# Patient Record
Sex: Male | Born: 1950 | Race: White | Hispanic: No
Health system: Southern US, Community
[De-identification: ages and names within clinical notes are randomized; demographics above are authoritative.]

## PROBLEM LIST (undated history)

## (undated) DIAGNOSIS — I480 Paroxysmal atrial fibrillation: Secondary | ICD-10-CM

## (undated) DIAGNOSIS — Z930 Tracheostomy status: Secondary | ICD-10-CM

## (undated) DIAGNOSIS — J9621 Acute and chronic respiratory failure with hypoxia: Secondary | ICD-10-CM

## (undated) DIAGNOSIS — J189 Pneumonia, unspecified organism: Secondary | ICD-10-CM

---

## 2020-11-02 ENCOUNTER — Inpatient Hospital Stay
Admit: 2020-11-02 | Discharge: 2020-12-22 | Disposition: E | Payer: Medicare Other | Source: Other Acute Inpatient Hospital | Attending: Internal Medicine | Admitting: Internal Medicine

## 2020-11-02 ENCOUNTER — Other Ambulatory Visit (HOSPITAL_COMMUNITY): Payer: Medicare Other

## 2020-11-02 DIAGNOSIS — J939 Pneumothorax, unspecified: Secondary | ICD-10-CM

## 2020-11-02 DIAGNOSIS — J942 Hemothorax: Secondary | ICD-10-CM

## 2020-11-02 DIAGNOSIS — Z931 Gastrostomy status: Secondary | ICD-10-CM

## 2020-11-02 DIAGNOSIS — R0902 Hypoxemia: Secondary | ICD-10-CM

## 2020-11-02 DIAGNOSIS — J984 Other disorders of lung: Secondary | ICD-10-CM

## 2020-11-02 DIAGNOSIS — Z9689 Presence of other specified functional implants: Secondary | ICD-10-CM

## 2020-11-02 DIAGNOSIS — I480 Paroxysmal atrial fibrillation: Secondary | ICD-10-CM | POA: Diagnosis present

## 2020-11-02 DIAGNOSIS — K7460 Unspecified cirrhosis of liver: Secondary | ICD-10-CM

## 2020-11-02 DIAGNOSIS — Z930 Tracheostomy status: Secondary | ICD-10-CM

## 2020-11-02 DIAGNOSIS — J9621 Acute and chronic respiratory failure with hypoxia: Secondary | ICD-10-CM | POA: Diagnosis present

## 2020-11-02 DIAGNOSIS — R1310 Dysphagia, unspecified: Secondary | ICD-10-CM

## 2020-11-02 DIAGNOSIS — Z9911 Dependence on respirator [ventilator] status: Secondary | ICD-10-CM

## 2020-11-02 DIAGNOSIS — R509 Fever, unspecified: Secondary | ICD-10-CM

## 2020-11-02 DIAGNOSIS — J189 Pneumonia, unspecified organism: Secondary | ICD-10-CM

## 2020-11-02 DIAGNOSIS — J9690 Respiratory failure, unspecified, unspecified whether with hypoxia or hypercapnia: Secondary | ICD-10-CM

## 2020-11-02 DIAGNOSIS — Z0189 Encounter for other specified special examinations: Secondary | ICD-10-CM

## 2020-11-02 HISTORY — DX: Other disorders of lung: J18.9

## 2020-11-02 HISTORY — DX: Acute and chronic respiratory failure with hypoxia: J96.21

## 2020-11-02 HISTORY — DX: Tracheostomy status: Z93.0

## 2020-11-02 HISTORY — DX: Paroxysmal atrial fibrillation: I48.0

## 2020-11-02 LAB — BLOOD GAS, ARTERIAL
Acid-Base Excess: 5 mmol/L — ABNORMAL HIGH (ref 0.0–2.0)
Bicarbonate: 32.7 mmol/L — ABNORMAL HIGH (ref 20.0–28.0)
FIO2: 50
O2 Saturation: 96.6 %
Patient temperature: 37
pCO2 arterial: 87.5 mmHg (ref 32.0–48.0)
pH, Arterial: 7.197 — CL (ref 7.350–7.450)
pO2, Arterial: 105 mmHg (ref 83.0–108.0)

## 2020-11-03 ENCOUNTER — Encounter: Payer: Self-pay | Admitting: Internal Medicine

## 2020-11-03 ENCOUNTER — Other Ambulatory Visit (HOSPITAL_COMMUNITY): Payer: Medicare Other

## 2020-11-03 DIAGNOSIS — J189 Pneumonia, unspecified organism: Secondary | ICD-10-CM | POA: Diagnosis not present

## 2020-11-03 DIAGNOSIS — J984 Other disorders of lung: Secondary | ICD-10-CM

## 2020-11-03 DIAGNOSIS — Z930 Tracheostomy status: Secondary | ICD-10-CM

## 2020-11-03 DIAGNOSIS — J9621 Acute and chronic respiratory failure with hypoxia: Secondary | ICD-10-CM | POA: Diagnosis not present

## 2020-11-03 DIAGNOSIS — I480 Paroxysmal atrial fibrillation: Secondary | ICD-10-CM | POA: Diagnosis not present

## 2020-11-03 LAB — APTT: aPTT: 38 seconds — ABNORMAL HIGH (ref 24–36)

## 2020-11-03 LAB — CBC
HCT: 26.2 % — ABNORMAL LOW (ref 39.0–52.0)
Hemoglobin: 7.4 g/dL — ABNORMAL LOW (ref 13.0–17.0)
MCH: 27.9 pg (ref 26.0–34.0)
MCHC: 28.2 g/dL — ABNORMAL LOW (ref 30.0–36.0)
MCV: 98.9 fL (ref 80.0–100.0)
Platelets: 210 10*3/uL (ref 150–400)
RBC: 2.65 MIL/uL — ABNORMAL LOW (ref 4.22–5.81)
RDW: 21.9 % — ABNORMAL HIGH (ref 11.5–15.5)
WBC: 12.4 10*3/uL — ABNORMAL HIGH (ref 4.0–10.5)
nRBC: 0 % (ref 0.0–0.2)

## 2020-11-03 LAB — BLOOD GAS, ARTERIAL
Acid-Base Excess: 6.2 mmol/L — ABNORMAL HIGH (ref 0.0–2.0)
Acid-Base Excess: 8.3 mmol/L — ABNORMAL HIGH (ref 0.0–2.0)
Bicarbonate: 33.1 mmol/L — ABNORMAL HIGH (ref 20.0–28.0)
Bicarbonate: 33.8 mmol/L — ABNORMAL HIGH (ref 20.0–28.0)
FIO2: 50
FIO2: 40
O2 Saturation: 98.1 %
O2 Saturation: 95.9 %
Patient temperature: 37
Patient temperature: 37
pCO2 arterial: 77.4 mmHg (ref 32.0–48.0)
pCO2 arterial: 62.3 mmHg — ABNORMAL HIGH (ref 32.0–48.0)
pH, Arterial: 7.254 — ABNORMAL LOW (ref 7.350–7.450)
pH, Arterial: 7.354 (ref 7.350–7.450)
pO2, Arterial: 115 mmHg — ABNORMAL HIGH (ref 83.0–108.0)
pO2, Arterial: 78.6 mmHg — ABNORMAL LOW (ref 83.0–108.0)

## 2020-11-03 LAB — HEMOGLOBIN A1C
Hgb A1c MFr Bld: 5.4 % (ref 4.8–5.6)
Mean Plasma Glucose: 108.28 mg/dL

## 2020-11-03 LAB — URINALYSIS, ROUTINE W REFLEX MICROSCOPIC
Bacteria, UA: NONE SEEN
Bilirubin Urine: NEGATIVE
Glucose, UA: NEGATIVE mg/dL
Ketones, ur: NEGATIVE mg/dL
Leukocytes,Ua: NEGATIVE
Nitrite: NEGATIVE
Protein, ur: 30 mg/dL — AB
RBC / HPF: >50 RBC/hpf — ABNORMAL HIGH (ref 0–5)
Specific Gravity, Urine: 1.015 (ref 1.005–1.030)
pH: 7 (ref 5.0–8.0)

## 2020-11-03 LAB — T4, FREE: Free T4: 0.7 ng/dL (ref 0.61–1.12)

## 2020-11-03 LAB — COMPREHENSIVE METABOLIC PANEL
ALT: 52 U/L — ABNORMAL HIGH (ref 0–44)
AST: 48 U/L — ABNORMAL HIGH (ref 15–41)
Albumin: 1.6 g/dL — ABNORMAL LOW (ref 3.5–5.0)
Alkaline Phosphatase: 75 U/L (ref 38–126)
Anion gap: 7 (ref 5–15)
BUN: 20 mg/dL (ref 8–23)
CO2: 33 mmol/L — ABNORMAL HIGH (ref 22–32)
Calcium: 8.4 mg/dL — ABNORMAL LOW (ref 8.9–10.3)
Chloride: 108 mmol/L (ref 98–111)
Creatinine, Ser: 0.77 mg/dL (ref 0.61–1.24)
GFR, Estimated: >60 mL/min (ref >60)
Glucose, Bld: 115 mg/dL — ABNORMAL HIGH (ref 70–99)
Potassium: 3.9 mmol/L (ref 3.5–5.1)
Sodium: 148 mmol/L — ABNORMAL HIGH (ref 135–145)
Total Bilirubin: 0.5 mg/dL (ref 0.3–1.2)
Total Protein: 6.7 g/dL (ref 6.5–8.1)

## 2020-11-03 LAB — PROTIME-INR
INR: 1.3 — ABNORMAL HIGH (ref 0.8–1.2)
Prothrombin Time: 15.6 seconds — ABNORMAL HIGH (ref 11.4–15.2)

## 2020-11-03 LAB — LACTIC ACID, PLASMA: Lactic Acid, Venous: 0.8 mmol/L (ref 0.5–1.9)

## 2020-11-03 LAB — VANCOMYCIN, TROUGH: Vancomycin Tr: 21 ug/mL (ref 15–20)

## 2020-11-03 LAB — TSH: TSH: 0.862 u[IU]/mL (ref 0.350–4.500)

## 2020-11-03 MED ORDER — IOHEXOL 300 MG/ML  SOLN
75.0000 mL | Freq: Once | INTRAMUSCULAR | Status: AC | PRN
  Administered 2020-11-03: 75 mL via INTRAVENOUS

## 2020-11-03 NOTE — Consult Note (Signed)
Pulmonary Critical Care Medicine [facility] [place]  PULMONARY SERVICE  Date of Service: 11/03/2020  PULMONARY CRITICAL CARE CONSULT   Michael Massey  ACZ:[id number]  DOB: 02/13/1951   DOA: 11/07/2020  Referring Physician: Carron Curie, MD  HPI: Michael Massey is a 69 y.o. male seen for follow up of Acute on Chronic Respiratory Failure.  Patient has on multiple medical problems including GERD pleural effusions enlarged lymph nodes hypertension came into the hospital because of increasing shortness of breath.  At the time of evaluation patient was found to have a cavitating mass with an air-fluid level in the lower lobes.  Patient also had some nodules noted.  The patient did undergo bronchoscopy which did not reveal malignancy but had inflammatory cells noted.  Apparently the condition became worse and patient ended up intubated on mechanical ventilation.  Hospital course was further complicated by development of sepsis and shock and atrial fibrillation.  Patient was not able to come off of the ventilator and subsequently had to have a tracheostomy done.  Transferred now to our facility for further management and weaning.  Review of Systems:  ROS performed and is unremarkable other than noted above.  Past medical history: Hypertension Sleep apnea Chronic Cough GERD Obesity Hernia   Past surgical history: Bronchoscopy Tracheostomy Hernia repair  Social history: Never smoker No alcohol or drug abuse  Family history: Diabetes Myocardial infarction  Medications: Reviewed on Rounds  Physical Exam:  Vitals: Temperature 98.0 pulse 86 respiratory 26 blood pressure is 100/56 saturations 95%  Ventilator Settings on assist control FiO2 is 45% tidal volume 530 PEEP 8  . General: Comfortable at this time . Eyes: Grossly normal lids, irises & conjunctiva . ENT: grossly tongue is normal . Neck: no obvious mass . Cardiovascular: S1-S2 normal no gallop or rub . Respiratory:  Scattered coarse breath sounds few rhonchi. . Abdomen: Soft and nontender. . Skin: no rash seen on limited exam . Musculoskeletal: not rigid . Psychiatric:unable to assess . Neurologic: no seizure no involuntary movements         Labs on Admission:  Basic Metabolic Panel: No results for input(s): NA, K, CL, CO2, GLUCOSE, BUN, CREATININE, CALCIUM, MG, PHOS in the last 168 hours.  Recent Labs  Lab 11/14/2020 2211 11/03/20 0040 11/03/20 0541  PHART 7.197* 7.254* 7.354  PCO2ART 87.5* 77.4* 62.3*  PO2ART 105 115* 78.6*  HCO3 32.7* 33.1* 33.8*  O2SAT 96.6 98.1 95.9    Liver Function Tests: No results for input(s): AST, ALT, ALKPHOS, BILITOT, PROT, ALBUMIN in the last 168 hours. No results for input(s): LIPASE, AMYLASE in the last 168 hours. No results for input(s): AMMONIA in the last 168 hours.  CBC: No results for input(s): WBC, NEUTROABS, HGB, HCT, MCV, PLT in the last 168 hours.  Cardiac Enzymes: No results for input(s): CKTOTAL, CKMB, CKMBINDEX, TROPONINI in the last 168 hours.  BNP (last 3 results) No results for input(s): BNP in the last 8760 hours.  ProBNP (last 3 results) No results for input(s): PROBNP in the last 8760 hours.   Radiological Exams on Admission: DG Abd 1 View  Result Date: 11/01/2020 CLINICAL DATA:  69 year old male respiratory failure, enteric tube placement. EXAM: ABDOMEN - 1 VIEW COMPARISON:  Portable chest tonight. FINDINGS: Portable AP supine view at 2222 hours. Enteric feeding tube courses from the left upper quadrant across midline and terminates at the level of the mid duodenum. Non obstructed bowel gas pattern. Coarse, reticular pulmonary opacity at both visible lung bases. No acute osseous  abnormality identified. IMPRESSION: Enteric feeding tube terminates in the mid duodenum. Electronically Signed   By: Odessa Fleming M.D.   On: 10/29/2020 22:37   DG Chest Port 1 View  Result Date: 11/12/2020 CLINICAL DATA:  69 year old male respiratory failure,  enteric tube placement. EXAM: PORTABLE CHEST 1 VIEW COMPARISON:  None. FINDINGS: Portable AP semi upright view at 2217 hours. Diffuse coarse pulmonary interstitial opacity. Medial left lower lung 10 cm cavitary lesion or emphysematous bulla. No pneumothorax. No definite pleural effusion. No cardiomegaly. Nonspecific superior mediastinal and right paratracheal increased mediastinal density. Enteric feeding tube courses through the chest into the abdomen. No acute osseous abnormality identified. IMPRESSION: 1. Enteric feeding tube courses through the chest into the abdomen. 2. Moderate to severe diffuse coarse pulmonary interstitial opacity which could be chronic lung disease and/or viral/atypical pneumonia. Pulmonary edema felt unlikely. 10 cm cavitary lesion or emphysematous bulla superimposed at the medial lung base. 3. Nonspecific widening of the right paratracheal, mediastinal contour. Query mediastinal mass or lymphadenopathy. Electronically Signed   By: Odessa Fleming M.D.   On: 10/29/2020 22:36    Assessment/Plan Active Problems:   Acute on chronic respiratory failure with hypoxia (HCC)   Paroxysmal atrial fibrillation (HCC)   Cavitary pneumonia   Tracheostomy status (HCC)   1. Acute on chronic respiratory failure with hypoxia remains on the ventilator and full support on assist control mode patient has not been tolerating attempts at weaning we will have respiratory therapy continue to assess the RSB I try pressure support today 2. Paroxysmal atrial fibrillation rate now rate controlled we will continue with supportive care anticoagulation precautions. 3. Cavitary pneumonia patient has large cavitary infectious process going on likely I would recommend getting a CT scan of the chest could be an infective bleb 4. Tracheostomy status tracheostomy will remain in place trachea  I have personally seen and evaluated the patient, evaluated laboratory and imaging results, formulated the assessment and plan  and placed orders. The Patient requires high complexity decision making with multiple systems involvement.  Case was discussed on Rounds with the Respiratory Therapy Director and the Respiratory staff Time Spent  Yevonne Pax, MD [facility] Pulmonary Critical Care Medicine Sleep Medicine

## 2020-11-04 DIAGNOSIS — J9621 Acute and chronic respiratory failure with hypoxia: Secondary | ICD-10-CM | POA: Diagnosis not present

## 2020-11-04 DIAGNOSIS — J189 Pneumonia, unspecified organism: Secondary | ICD-10-CM | POA: Diagnosis not present

## 2020-11-04 DIAGNOSIS — Z930 Tracheostomy status: Secondary | ICD-10-CM | POA: Diagnosis not present

## 2020-11-04 DIAGNOSIS — I480 Paroxysmal atrial fibrillation: Secondary | ICD-10-CM | POA: Diagnosis not present

## 2020-11-04 LAB — URINE CULTURE: Culture: NO GROWTH

## 2020-11-04 LAB — BASIC METABOLIC PANEL
Anion gap: 7 (ref 5–15)
BUN: 19 mg/dL (ref 8–23)
CO2: 34 mmol/L — ABNORMAL HIGH (ref 22–32)
Calcium: 8.4 mg/dL — ABNORMAL LOW (ref 8.9–10.3)
Chloride: 106 mmol/L (ref 98–111)
Creatinine, Ser: 0.73 mg/dL (ref 0.61–1.24)
GFR, Estimated: >60 mL/min (ref >60)
Glucose, Bld: 134 mg/dL — ABNORMAL HIGH (ref 70–99)
Potassium: 3.4 mmol/L — ABNORMAL LOW (ref 3.5–5.1)
Sodium: 147 mmol/L — ABNORMAL HIGH (ref 135–145)

## 2020-11-04 LAB — CBC
HCT: 27 % — ABNORMAL LOW (ref 39.0–52.0)
Hemoglobin: 7.6 g/dL — ABNORMAL LOW (ref 13.0–17.0)
MCH: 27.8 pg (ref 26.0–34.0)
MCHC: 28.1 g/dL — ABNORMAL LOW (ref 30.0–36.0)
MCV: 98.9 fL (ref 80.0–100.0)
Platelets: 205 10*3/uL (ref 150–400)
RBC: 2.73 MIL/uL — ABNORMAL LOW (ref 4.22–5.81)
RDW: 22 % — ABNORMAL HIGH (ref 11.5–15.5)
WBC: 12.5 10*3/uL — ABNORMAL HIGH (ref 4.0–10.5)
nRBC: 0 % (ref 0.0–0.2)

## 2020-11-04 LAB — VANCOMYCIN, TROUGH: Vancomycin Tr: 13 ug/mL — ABNORMAL LOW (ref 15–20)

## 2020-11-04 LAB — MAGNESIUM: Magnesium: 2.1 mg/dL (ref 1.7–2.4)

## 2020-11-04 LAB — PHOSPHORUS: Phosphorus: 3.2 mg/dL (ref 2.5–4.6)

## 2020-11-04 NOTE — Progress Notes (Signed)
Pulmonary Critical Care Medicine [facility] [place]   PULMONARY CRITICAL CARE SERVICE  PROGRESS NOTE  Date of Service: 11/04/2020  Michael Massey  MVH:[id number]  DOB: 1951/05/16   DOA: 21-Nov-2020  Referring Physician: Carron Curie, MD  HPI: Michael Massey is a 69 y.o. male seen for follow up of Acute on Chronic Respiratory Failure. 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 Did not appear that any of the bullae are infected  Medications: Reviewed on Rounds  Physical Exam:  Vitals: Temperature is 98.1 pulse 88 respiratory 24 blood pressure is 135/69 saturations 98%  Ventilator Settings on pressure support FiO2 is 45%   General: Comfortable at this time  Eyes: Grossly normal lids, irises & conjunctiva  ENT: grossly tongue is normal  Neck: no obvious mass  Cardiovascular: S1 S2 normal no gallop  Respiratory: No rhonchi very coarse breath sounds  Abdomen: soft  Skin: no rash seen on limited exam  Musculoskeletal: not rigid  Psychiatric:unable to assess  Neurologic: no seizure no involuntary movements         Lab Data:   Basic Metabolic Panel: Recent Labs  Lab 11/03/20 1041 11/04/20 0512  NA 148* 147*  K 3.9 3.4*  CL 108 106  CO2 33* 34*  GLUCOSE 115* 134*  BUN 20 19  CREATININE 0.77 0.73  CALCIUM 8.4* 8.4*  MG  --  2.1  PHOS  --  3.2    ABG: Recent Labs  Lab 11/21/2020 2211 11/03/20 0040 11/03/20 0541  PHART 7.197* 7.254* 7.354  PCO2ART 87.5* 77.4* 62.3*  PO2ART 105 115* 78.6*  HCO3 32.7* 33.1* 33.8*  O2SAT 96.6 98.1 95.9    Liver Function Tests: Recent Labs  Lab 11/03/20 1041  AST 48*  ALT 52*  ALKPHOS 75  BILITOT 0.5  PROT 6.7  ALBUMIN 1.6*   No results for input(s): LIPASE, AMYLASE in the last 168 hours. No results for input(s): AMMONIA in the last 168 hours.  CBC: Recent Labs  Lab 11/03/20 1041 11/04/20 0512  WBC 12.4* 12.5*  HGB 7.4* 7.6*  HCT 26.2* 27.0*  MCV 98.9 98.9   PLT 210 205    Cardiac Enzymes: No results for input(s): CKTOTAL, CKMB, CKMBINDEX, TROPONINI in the last 168 hours.  BNP (last 3 results) No results for input(s): BNP in the last 8760 hours.  ProBNP (last 3 results) No results for input(s): PROBNP in the last 8760 hours.  Radiological Exams: DG Abd 1 View  Result Date: 2020/11/21 CLINICAL DATA:  69 year old male respiratory failure, enteric tube placement. EXAM: ABDOMEN - 1 VIEW COMPARISON:  Portable chest tonight. FINDINGS: Portable AP supine view at 2222 hours. Enteric feeding tube courses from the left upper quadrant across midline and terminates at the level of the mid duodenum. Non obstructed bowel gas pattern. Coarse, reticular pulmonary opacity at both visible lung bases. No acute osseous abnormality identified. IMPRESSION: Enteric feeding tube terminates in the mid duodenum. Electronically Signed   By: Odessa Fleming M.D.   On: 21-Nov-2020 22:37   CT CHEST W CONTRAST  Result Date: 11/03/2020 CLINICAL DATA:  69 year old male with cancer of unknown origin. Staging. EXAM: CT CHEST WITH CONTRAST TECHNIQUE: Multidetector CT imaging of the chest was performed during intravenous contrast administration. CONTRAST:  41mL OMNIPAQUE IOHEXOL 300 MG/ML  SOLN COMPARISON:  Chest radiograph dated 21-Nov-2020. FINDINGS: Cardiovascular: There is no cardiomegaly or pericardial effusion. There is coronary vascular calcification. The thoracic aorta is unremarkable. The origins of the  great vessels of the aortic arch appear patent. Evaluation of the pulmonary arteries is limited due to respiratory motion artifact and suboptimal opacification and timing of the contrast. The central pulmonary arteries are grossly unremarkable. Mediastinum/Nodes: Mediastinal adenopathy measure 18 mm short axis along the right trachea, 13 mm posterior to the trachea, 14 mm in the subcarinal space. Evaluation of the hilar lymph nodes is somewhat limited due to consolidative changes of  the lungs. A feeding tube is noted within the esophagus. No mediastinal fluid collection. Lungs/Pleura: There are small bilateral pleural effusions. There is background of emphysema with subpleural blebs. There is diffuse ground-glass opacity throughout the lungs. There are bilateral bronchiectasis. There are small pleural effusions in the fissures bilaterally. There is a 13 mm nodule in the left lower lobe along the fissure. Scattered nodular densities noted in the right middle lobe. There is no pneumothorax. The central airways are patent. Tracheostomy with tip 5.5 cm above the carina. Upper Abdomen: Small bilateral adrenal nodules, indeterminate. Partially visualized left renal upper pole cyst. Musculoskeletal: Degenerative changes of the spine. No acute osseous pathology. IMPRESSION: 1. Diffuse ground-glass opacities throughout the lungs may represent edema, ARDS, or pneumonia. There is probable background of interstitial lung disease with bronchiectasis as well as emphysema and bullous changes of the lungs. 2. A 13 mm pulmonary nodule in the left lower lobe along the fissure. Additional smaller nodules in the right middle and right upper lobes. Clinical correlation and follow-up with CT in 3 months recommended. 3. Mediastinal adenopathy. 4. Small bilateral adrenal nodules, indeterminate. 5. Aortic Atherosclerosis (ICD10-I70.0) and Emphysema (ICD10-J43.9). Electronically Signed   By: Elgie Collard M.D.   On: 11/03/2020 15:55   DG Chest Port 1 View  Result Date: 11/17/2020 CLINICAL DATA:  69 year old male respiratory failure, enteric tube placement. EXAM: PORTABLE CHEST 1 VIEW COMPARISON:  None. FINDINGS: Portable AP semi upright view at 2217 hours. Diffuse coarse pulmonary interstitial opacity. Medial left lower lung 10 cm cavitary lesion or emphysematous bulla. No pneumothorax. No definite pleural effusion. No cardiomegaly. Nonspecific superior mediastinal and right paratracheal increased mediastinal  density. Enteric feeding tube courses through the chest into the abdomen. No acute osseous abnormality identified. IMPRESSION: 1. Enteric feeding tube courses through the chest into the abdomen. 2. Moderate to severe diffuse coarse pulmonary interstitial opacity which could be chronic lung disease and/or viral/atypical pneumonia. Pulmonary edema felt unlikely. 10 cm cavitary lesion or emphysematous bulla superimposed at the medial lung base. 3. Nonspecific widening of the right paratracheal, mediastinal contour. Query mediastinal mass or lymphadenopathy. Electronically Signed   By: Odessa Fleming M.D.   On: 11/12/2020 22:36    Assessment/Plan Active Problems:   Acute on chronic respiratory failure with hypoxia (HCC)   Paroxysmal atrial fibrillation (HCC)   Cavitary pneumonia   Tracheostomy status (HCC)   1. Acute on chronic respiratory failure with hypoxia patient continues on pressure support the goal for weaning is 4 hours today in light of the findings of the CT scan it will be a difficult weaning process but we will see how the patient is able to tolerate 2. Paroxysmal atrial fibrillation rate now rate controlled 3. Cavitary pneumonia it appears that the cavitary pneumonia is more likely actually bullous emphysema on the CT 4. Tracheostomy will remain in place   I have personally seen and evaluated the patient, evaluated laboratory and imaging results, formulated the assessment and plan and placed orders. The Patient requires high complexity decision making with multiple systems involvement.  Rounds were done  with the Respiratory Therapy Director and Staff therapists and discussed with nursing staff also.  Allyne Gee, MD [facility] Pulmonary Critical Care Medicine Sleep Medicine

## 2020-11-05 DIAGNOSIS — I480 Paroxysmal atrial fibrillation: Secondary | ICD-10-CM | POA: Diagnosis not present

## 2020-11-05 DIAGNOSIS — J9621 Acute and chronic respiratory failure with hypoxia: Secondary | ICD-10-CM | POA: Diagnosis not present

## 2020-11-05 DIAGNOSIS — Z930 Tracheostomy status: Secondary | ICD-10-CM | POA: Diagnosis not present

## 2020-11-05 DIAGNOSIS — J189 Pneumonia, unspecified organism: Secondary | ICD-10-CM | POA: Diagnosis not present

## 2020-11-05 LAB — BASIC METABOLIC PANEL
Anion gap: 6 (ref 5–15)
BUN: 17 mg/dL (ref 8–23)
CO2: 31 mmol/L (ref 22–32)
Calcium: 8.2 mg/dL — ABNORMAL LOW (ref 8.9–10.3)
Chloride: 107 mmol/L (ref 98–111)
Creatinine, Ser: 0.62 mg/dL (ref 0.61–1.24)
GFR, Estimated: >60 mL/min (ref >60)
Glucose, Bld: 134 mg/dL — ABNORMAL HIGH (ref 70–99)
Potassium: 3.5 mmol/L (ref 3.5–5.1)
Sodium: 144 mmol/L (ref 135–145)

## 2020-11-05 LAB — CULTURE, RESPIRATORY W GRAM STAIN: Culture: NORMAL

## 2020-11-05 LAB — CBC
HCT: 26.7 % — ABNORMAL LOW (ref 39.0–52.0)
Hemoglobin: 7.6 g/dL — ABNORMAL LOW (ref 13.0–17.0)
MCH: 28 pg (ref 26.0–34.0)
MCHC: 28.5 g/dL — ABNORMAL LOW (ref 30.0–36.0)
MCV: 98.5 fL (ref 80.0–100.0)
Platelets: 180 10*3/uL (ref 150–400)
RBC: 2.71 MIL/uL — ABNORMAL LOW (ref 4.22–5.81)
RDW: 21.7 % — ABNORMAL HIGH (ref 11.5–15.5)
WBC: 13.5 10*3/uL — ABNORMAL HIGH (ref 4.0–10.5)
nRBC: 0 % (ref 0.0–0.2)

## 2020-11-05 LAB — MAGNESIUM: Magnesium: 2 mg/dL (ref 1.7–2.4)

## 2020-11-05 LAB — PHOSPHORUS: Phosphorus: 2.7 mg/dL (ref 2.5–4.6)

## 2020-11-05 NOTE — Progress Notes (Signed)
Pulmonary Critical Care Medicine [facility] [place]   PULMONARY CRITICAL CARE SERVICE  PROGRESS NOTE  Date of Service: 11/05/2020  Michael Massey  [id number]:[id number]  DOB: 12-30-1950   DOA: 2020-11-17  Referring Physician: Carron Curie, MD  HPI: Michael Massey is a 69 y.o. male seen for follow up of Acute on Chronic Respiratory Failure.  Patient currently is on assist control has been on 45% FiO2  Medications: Reviewed on Rounds  Physical Exam:  Vitals: Temperature is 98.4 pulse 71 respiratory rate 25 blood pressure is 119/61 saturations 100%  Ventilator Settings on assist control FiO2 is 45% tidal volume 530 PEEP of 8   General: Comfortable at this time  Eyes: Grossly normal lids, irises & conjunctiva  ENT: grossly tongue is normal  Neck: no obvious mass  Cardiovascular: S1 S2 normal no gallop  Respiratory: No rhonchi no rales are noted    Abdomen: soft  Skin: no rash seen on limited exam  Musculoskeletal: not rigid  Psychiatric:unable to assess  Neurologic: no seizure no involuntary movements         Lab Data:   Basic Metabolic Panel: Recent Labs  Lab 11/03/20 1041 11/04/20 0512 11/05/20 0649  NA 148* 147* 144  K 3.9 3.4* 3.5  CL 108 106 107  CO2 33* 34* 31  GLUCOSE 115* 134* 134*  BUN 20 19 17   CREATININE 0.77 0.73 0.62  CALCIUM 8.4* 8.4* 8.2*  MG  --  2.1 2.0  PHOS  --  3.2 2.7    ABG: Recent Labs  Lab 11/17/20 2211 11/03/20 0040 11/03/20 0541  PHART 7.197* 7.254* 7.354  PCO2ART 87.5* 77.4* 62.3*  PO2ART 105 115* 78.6*  HCO3 32.7* 33.1* 33.8*  O2SAT 96.6 98.1 95.9    Liver Function Tests: Recent Labs  Lab 11/03/20 1041  AST 48*  ALT 52*  ALKPHOS 75  BILITOT 0.5  PROT 6.7  ALBUMIN 1.6*   No results for input(s): LIPASE, AMYLASE in the last 168 hours. No results for input(s): AMMONIA in the last 168 hours.  CBC: Recent Labs  Lab 11/03/20 1041 11/04/20 0512 11/05/20 0649  WBC 12.4* 12.5* 13.5*  HGB 7.4* 7.6*  7.6*  HCT 26.2* 27.0* 26.7*  MCV 98.9 98.9 98.5  PLT 210 205 180    Cardiac Enzymes: No results for input(s): CKTOTAL, CKMB, CKMBINDEX, TROPONINI in the last 168 hours.  BNP (last 3 results) No results for input(s): BNP in the last 8760 hours.  ProBNP (last 3 results) No results for input(s): PROBNP in the last 8760 hours.  Radiological Exams: CT CHEST W CONTRAST  Result Date: 11/03/2020 CLINICAL DATA:  69 year old male with cancer of unknown origin. Staging. EXAM: CT CHEST WITH CONTRAST TECHNIQUE: Multidetector CT imaging of the chest was performed during intravenous contrast administration. CONTRAST:  39mL OMNIPAQUE IOHEXOL 300 MG/ML  SOLN COMPARISON:  Chest radiograph dated 17-Nov-2020. FINDINGS: Cardiovascular: There is no cardiomegaly or pericardial effusion. There is coronary vascular calcification. The thoracic aorta is unremarkable. The origins of the great vessels of the aortic arch appear patent. Evaluation of the pulmonary arteries is limited due to respiratory motion artifact and suboptimal opacification and timing of the contrast. The central pulmonary arteries are grossly unremarkable. Mediastinum/Nodes: Mediastinal adenopathy measure 18 mm short axis along the right trachea, 13 mm posterior to the trachea, 14 mm in the subcarinal space. Evaluation of the hilar lymph nodes is somewhat limited due to consolidative changes of the lungs. A feeding tube is noted within the esophagus. No mediastinal fluid  collection. Lungs/Pleura: There are small bilateral pleural effusions. There is background of emphysema with subpleural blebs. There is diffuse ground-glass opacity throughout the lungs. There are bilateral bronchiectasis. There are small pleural effusions in the fissures bilaterally. There is a 13 mm nodule in the left lower lobe along the fissure. Scattered nodular densities noted in the right middle lobe. There is no pneumothorax. The central airways are patent. Tracheostomy with tip  5.5 cm above the carina. Upper Abdomen: Small bilateral adrenal nodules, indeterminate. Partially visualized left renal upper pole cyst. Musculoskeletal: Degenerative changes of the spine. No acute osseous pathology. IMPRESSION: 1. Diffuse ground-glass opacities throughout the lungs may represent edema, ARDS, or pneumonia. There is probable background of interstitial lung disease with bronchiectasis as well as emphysema and bullous changes of the lungs. 2. A 13 mm pulmonary nodule in the left lower lobe along the fissure. Additional smaller nodules in the right middle and right upper lobes. Clinical correlation and follow-up with CT in 3 months recommended. 3. Mediastinal adenopathy. 4. Small bilateral adrenal nodules, indeterminate. 5. Aortic Atherosclerosis (ICD10-I70.0) and Emphysema (ICD10-J43.9). Electronically Signed   By: Elgie Collard M.D.   On: 11/03/2020 15:55    Assessment/Plan Active Problems:   Acute on chronic respiratory failure with hypoxia (HCC)   Paroxysmal atrial fibrillation (HCC)   Cavitary pneumonia   Tracheostomy status (HCC)   1. Acute on chronic respiratory failure hypoxia we will continue with comfort full support on the ventilator.  Respiratory therapy will check the RSB I mechanics 2. Paroxysmal atrial fibrillation rate is controlled we will continue with supportive care. 3. Cavitary pneumonia patient has severe bullous emphysematous changes. 4. Tracheostomy will remain in place   I have personally seen and evaluated the patient, evaluated laboratory and imaging results, formulated the assessment and plan and placed orders. The Patient requires high complexity decision making with multiple systems involvement.  Rounds were done with the Respiratory Therapy Director and Staff therapists and discussed with nursing staff also.  Yevonne Pax, MD [facility] Pulmonary Critical Care Medicine Sleep Medicine

## 2020-11-06 DIAGNOSIS — Z930 Tracheostomy status: Secondary | ICD-10-CM | POA: Diagnosis not present

## 2020-11-06 DIAGNOSIS — J189 Pneumonia, unspecified organism: Secondary | ICD-10-CM | POA: Diagnosis not present

## 2020-11-06 DIAGNOSIS — I480 Paroxysmal atrial fibrillation: Secondary | ICD-10-CM | POA: Diagnosis not present

## 2020-11-06 DIAGNOSIS — J9621 Acute and chronic respiratory failure with hypoxia: Secondary | ICD-10-CM | POA: Diagnosis not present

## 2020-11-06 LAB — VANCOMYCIN, TROUGH: Vancomycin Tr: 15 ug/mL (ref 15–20)

## 2020-11-06 NOTE — Progress Notes (Signed)
Pulmonary Critical Care Medicine [facility] [place]   PULMONARY CRITICAL CARE SERVICE  PROGRESS NOTE  Date of Service: 11/06/2020  Michael Massey  AVW:[id number]  DOB: Jun 05, 1951   DOA: 2020-11-14  Referring Physician: Carron Curie, MD  HPI: Michael Massey is a 69 y.o. male seen for follow up of Acute on Chronic Respiratory Failure.  The patient currently is on the ventilator and full support has been on assist control mode and currently is on 45% FiO2  Medications: Reviewed on Rounds  Physical Exam:  Vitals: Temperature 97.9 pulse 86 respiratory rate is 21 blood pressure is 123/68 saturations 98%  Ventilator Settings on assist control FiO2 45% PEEP 7  . General: Comfortable at this time . Eyes: Grossly normal lids, irises & conjunctiva . ENT: grossly tongue is normal . Neck: no obvious mass . Cardiovascular: S1 S2 normal no gallop . Respiratory: Scattered coarse rhonchi noted bilaterally . Abdomen: soft . Skin: no rash seen on limited exam . Musculoskeletal: not rigid . Psychiatric:unable to assess . Neurologic: no seizure no involuntary movements         Lab Data:   Basic Metabolic Panel: Recent Labs  Lab 11/03/20 1041 11/04/20 0512 11/05/20 0649  NA 148* 147* 144  K 3.9 3.4* 3.5  CL 108 106 107  CO2 33* 34* 31  GLUCOSE 115* 134* 134*  BUN 20 19 17   CREATININE 0.77 0.73 0.62  CALCIUM 8.4* 8.4* 8.2*  MG  --  2.1 2.0  PHOS  --  3.2 2.7    ABG: Recent Labs  Lab 11/14/20 2211 11/03/20 0040 11/03/20 0541  PHART 7.197* 7.254* 7.354  PCO2ART 87.5* 77.4* 62.3*  PO2ART 105 115* 78.6*  HCO3 32.7* 33.1* 33.8*  O2SAT 96.6 98.1 95.9    Liver Function Tests: Recent Labs  Lab 11/03/20 1041  AST 48*  ALT 52*  ALKPHOS 75  BILITOT 0.5  PROT 6.7  ALBUMIN 1.6*   No results for input(s): LIPASE, AMYLASE in the last 168 hours. No results for input(s): AMMONIA in the last 168 hours.  CBC: Recent Labs  Lab 11/03/20 1041 11/04/20 0512  11/05/20 0649  WBC 12.4* 12.5* 13.5*  HGB 7.4* 7.6* 7.6*  HCT 26.2* 27.0* 26.7*  MCV 98.9 98.9 98.5  PLT 210 205 180    Cardiac Enzymes: No results for input(s): CKTOTAL, CKMB, CKMBINDEX, TROPONINI in the last 168 hours.  BNP (last 3 results) No results for input(s): BNP in the last 8760 hours.  ProBNP (last 3 results) No results for input(s): PROBNP in the last 8760 hours.  Radiological Exams: No results found.  Assessment/Plan Active Problems:   Acute on chronic respiratory failure with hypoxia (HCC)   Paroxysmal atrial fibrillation (HCC)   Cavitary pneumonia   Tracheostomy status (HCC)   1. Chronic respiratory failure with hypoxia we will continue with the weaning process try on pressure support wean today 2. Paroxysmal atrial fibrillation rate is controlled at this time 3. Cavitary pneumonia with severe bullous emphysematous changes 4. Tracheostomy remains in place   I have personally seen and evaluated the patient, evaluated laboratory and imaging results, formulated the assessment and plan and placed orders. The Patient requires high complexity decision making with multiple systems involvement.  Rounds were done with the Respiratory Therapy Director and Staff therapists and discussed with nursing staff also.  11/07/20, MD [facility] Pulmonary Critical Care Medicine Sleep Medicine

## 2020-11-07 ENCOUNTER — Other Ambulatory Visit (HOSPITAL_COMMUNITY): Payer: Medicare Other

## 2020-11-07 DIAGNOSIS — Z930 Tracheostomy status: Secondary | ICD-10-CM | POA: Diagnosis not present

## 2020-11-07 DIAGNOSIS — I480 Paroxysmal atrial fibrillation: Secondary | ICD-10-CM | POA: Diagnosis not present

## 2020-11-07 DIAGNOSIS — J9621 Acute and chronic respiratory failure with hypoxia: Secondary | ICD-10-CM | POA: Diagnosis not present

## 2020-11-07 DIAGNOSIS — J189 Pneumonia, unspecified organism: Secondary | ICD-10-CM | POA: Diagnosis not present

## 2020-11-07 LAB — BASIC METABOLIC PANEL
Anion gap: 6 (ref 5–15)
BUN: 24 mg/dL — ABNORMAL HIGH (ref 8–23)
CO2: 31 mmol/L (ref 22–32)
Calcium: 8.8 mg/dL — ABNORMAL LOW (ref 8.9–10.3)
Chloride: 106 mmol/L (ref 98–111)
Creatinine, Ser: 0.65 mg/dL (ref 0.61–1.24)
GFR, Estimated: >60 mL/min (ref >60)
Glucose, Bld: 118 mg/dL — ABNORMAL HIGH (ref 70–99)
Potassium: 3.7 mmol/L (ref 3.5–5.1)
Sodium: 143 mmol/L (ref 135–145)

## 2020-11-07 LAB — ECHOCARDIOGRAM COMPLETE
AR max vel: 2.94 cm2
AV Area VTI: 3.07 cm2
AV Area mean vel: 2.8 cm2
AV Mean grad: 3 mmHg
AV Peak grad: 6.2 mmHg
Ao pk vel: 1.24 m/s
S' Lateral: 3.6 cm

## 2020-11-07 LAB — MAGNESIUM: Magnesium: 2 mg/dL (ref 1.7–2.4)

## 2020-11-07 NOTE — Progress Notes (Signed)
Pulmonary Critical Care Medicine [facility] [place]   PULMONARY CRITICAL CARE SERVICE  PROGRESS NOTE  Date of Service: 11/07/2020  Michael Massey  FHL:[id number]  DOB: [birth year]   DOA: 11/19/2020  Referring Physician: Carron Curie, MD  HPI: Michael Massey is a 69 y.o. male seen for follow up of Acute on Chronic Respiratory Failure.  Patient right now is on full support on assist control mode has been on 45% oxygen  Medications: Reviewed on Rounds  Physical Exam:  Vitals: Temperature 98.3 pulse 118 respiratory rate 35 blood pressure is 94/86 saturations 99%  Ventilator Settings on assist control FiO2 is 45% tidal volume 530 PEEP 7  . General: Comfortable at this time . Eyes: Grossly normal lids, irises & conjunctiva . ENT: grossly tongue is normal . Neck: no obvious mass . Cardiovascular: S1 S2 normal no gallop . Respiratory: No rhonchi very coarse breath sounds . Abdomen: soft . Skin: no rash seen on limited exam . Musculoskeletal: not rigid . Psychiatric:unable to assess . Neurologic: no seizure no involuntary movements         Lab Data:   Basic Metabolic Panel: Recent Labs  Lab 11/03/20 1041 11/04/20 0512 11/05/20 0649  NA 148* 147* 144  K 3.9 3.4* 3.5  CL 108 106 107  CO2 33* 34* 31  GLUCOSE 115* 134* 134*  BUN 20 19 17   CREATININE 0.77 0.73 0.62  CALCIUM 8.4* 8.4* 8.2*  MG  --  2.1 2.0  PHOS  --  3.2 2.7    ABG: Recent Labs  Lab 10/24/2020 2211 11/03/20 0040 11/03/20 0541  PHART 7.197* 7.254* 7.354  PCO2ART 87.5* 77.4* 62.3*  PO2ART 105 115* 78.6*  HCO3 32.7* 33.1* 33.8*  O2SAT 96.6 98.1 95.9    Liver Function Tests: Recent Labs  Lab 11/03/20 1041  AST 48*  ALT 52*  ALKPHOS 75  BILITOT 0.5  PROT 6.7  ALBUMIN 1.6*   No results for input(s): LIPASE, AMYLASE in the last 168 hours. No results for input(s): AMMONIA in the last 168 hours.  CBC: Recent Labs  Lab 11/03/20 1041 11/04/20 0512 11/05/20 0649  WBC 12.4* 12.5* 13.5*   HGB 7.4* 7.6* 7.6*  HCT 26.2* 27.0* 26.7*  MCV 98.9 98.9 98.5  PLT 210 205 180    Cardiac Enzymes: No results for input(s): CKTOTAL, CKMB, CKMBINDEX, TROPONINI in the last 168 hours.  BNP (last 3 results) No results for input(s): BNP in the last 8760 hours.  ProBNP (last 3 results) No results for input(s): PROBNP in the last 8760 hours.  Radiological Exams: No results found.  Assessment/Plan Active Problems:   Acute on chronic respiratory failure with hypoxia (HCC)   Paroxysmal atrial fibrillation (HCC)   Cavitary pneumonia   Tracheostomy status (HCC)   1. Acute on chronic respiratory failure with hypoxia we'll continue with assist control mode patient's mechanics have been poor respiratory therapy will continue to assess 2. Paroxysmal atrial fibrillation rate is controlled we'll continue to follow 3. Cavitary pneumonia treated 4. Tracheostomy remains in place   I have personally seen and evaluated the patient, evaluated laboratory and imaging results, formulated the assessment and plan and placed orders. The Patient requires high complexity decision making with multiple systems involvement.  Rounds were done with the Respiratory Therapy Director and Staff therapists and discussed with nursing staff also.  11/07/20, MD [facility] Pulmonary Critical Care Medicine Sleep Medicine

## 2020-11-07 NOTE — Progress Notes (Signed)
  Echocardiogram 2D Echocardiogram has been performed.  Gerda Diss 11/07/2020, 5:12 PM

## 2020-11-07 NOTE — Consult Note (Signed)
Referring Physician: S. Manson Passey, MD  Michael Massey is an 69 y.o. male.                       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 He is on full ventilator support with FiO2 of 45 %. His TSH and T4 levels are normal. Metoprolol has not worked in controlling heart rate of 140 to 160 bpm.  Past Medical History:  Diagnosis Date  . Acute on chronic respiratory failure with hypoxia (HCC)   . Cavitary pneumonia   . Paroxysmal atrial fibrillation (HCC)   . Tracheostomy status (HCC)     Past surgical history: Bronchoscopy Tracheostomy Hernia repair  Social history: Never smoker No alcohol or drug abuse  Family history: Diabetes Myocardial infarction  Social History:  has no history on file for tobacco use, alcohol use, and drug use.  Allergies: Not on File  No medications prior to admission.  See medication list on chart.  Results for orders placed or performed during the hospital encounter of 11/18/2020 (from the past 48 hour(s))  Vancomycin, trough     Status: None   Collection Time: 11/06/20 11:29 AM  Result Value Ref Range   Vancomycin Tr 15 15 - 20 ug/mL    Comment: Performed at [facility], [address], [place], [place] [address]   No results found.  Review Of Systems As per HPI.  There were no vitals taken for this visit. There is no height or weight on file to calculate BMI. General appearance: Comfortable, appears stated age and moderate respiratory distress Head: Normocephalic, atraumatic. Eyes: Blue eyes, pink conjunctiva, corneas clear. PERRL, EOM's intact. Neck: No adenopathy, no carotid bruit, no JVD, supple, symmetrical, trachea midline and thyroid not enlarged. Resp: Coarse crackles to auscultation  bilaterally. Cardio: Tachycardic, Regular rate and rhythm, S1, S2 normal, II/VI systolic murmur, no click, rub or gallop GI: Soft, non-tender; bowel sounds normal. Extremities: 2 + generalized edema, no cyanosis or clubbing. Skin: Warm and dry.  Neurologic: Alert and oriented X 0.  Assessment/Plan Acute on chronic respiratory failure ARDS Atypical pneumonia/Cavitary pneumonia Atrial fibrillation with RVR,[id number] score of 3 S/P tracheostomy  IV digoxin and IV amiodarone for rate control. Will get echocardiogram. If LV function is good consider diltiazem over digoxin and amiodarone. Currently blood pressure is on low side.  Time spent: Review of old records, Lab, x-rays, EKG, other cardiac tests, examination, discussion with patient over 70 minutes.  Ricki Rodriguez, MD  11/07/2020, 3:10 PM

## 2020-11-08 DIAGNOSIS — J189 Pneumonia, unspecified organism: Secondary | ICD-10-CM | POA: Diagnosis not present

## 2020-11-08 DIAGNOSIS — I480 Paroxysmal atrial fibrillation: Secondary | ICD-10-CM | POA: Diagnosis not present

## 2020-11-08 DIAGNOSIS — J9621 Acute and chronic respiratory failure with hypoxia: Secondary | ICD-10-CM | POA: Diagnosis not present

## 2020-11-08 DIAGNOSIS — Z930 Tracheostomy status: Secondary | ICD-10-CM | POA: Diagnosis not present

## 2020-11-08 LAB — BASIC METABOLIC PANEL
Anion gap: 7 (ref 5–15)
BUN: 28 mg/dL — ABNORMAL HIGH (ref 8–23)
CO2: 32 mmol/L (ref 22–32)
Calcium: 8.8 mg/dL — ABNORMAL LOW (ref 8.9–10.3)
Chloride: 107 mmol/L (ref 98–111)
Creatinine, Ser: 0.67 mg/dL (ref 0.61–1.24)
GFR, Estimated: >60 mL/min (ref >60)
Glucose, Bld: 116 mg/dL — ABNORMAL HIGH (ref 70–99)
Potassium: 4.1 mmol/L (ref 3.5–5.1)
Sodium: 146 mmol/L — ABNORMAL HIGH (ref 135–145)

## 2020-11-08 LAB — CBC
HCT: 26 % — ABNORMAL LOW (ref 39.0–52.0)
Hemoglobin: 7.4 g/dL — ABNORMAL LOW (ref 13.0–17.0)
MCH: 28.5 pg (ref 26.0–34.0)
MCHC: 28.5 g/dL — ABNORMAL LOW (ref 30.0–36.0)
MCV: 100 fL (ref 80.0–100.0)
Platelets: 209 10*3/uL (ref 150–400)
RBC: 2.6 MIL/uL — ABNORMAL LOW (ref 4.22–5.81)
RDW: 21.2 % — ABNORMAL HIGH (ref 11.5–15.5)
WBC: 13.8 10*3/uL — ABNORMAL HIGH (ref 4.0–10.5)
nRBC: 0 % (ref 0.0–0.2)

## 2020-11-08 LAB — OCCULT BLOOD X 1 CARD TO LAB, STOOL: Fecal Occult Bld: NEGATIVE

## 2020-11-08 NOTE — Consult Note (Signed)
Ref: Rodman Pickle, MD   Subjective:  Atrial fibrillation with RVR continues. Heart rate lower. Echocardiogram shows preserved LV systolic function with EF 50-55 %, LVH and Mild MR and TR.  Objective:  Vital Signs in the last 24 hours: BP: 121/57 P: 120, R: 35, FiO2 45 %, O2 sat 96 %.  Physical Exam: BP Readings from Last 1 Encounters:  No data found for BP     Wt Readings from Last 1 Encounters:  No data found for Wt    Weight change:  There is no height or weight on file to calculate BMI. HEENT: [place]/AT, Eyes-Blue, Conjunctiva-Pale, Sclera-Non-icteric Neck: No JVD, No bruit, Trachea midline. Lungs:  Coarse crackles, Bilateral. Cardiac:  Irregular rhythm, normal S1 and S2, no S3. II/VI systolic murmur. Abdomen:  Soft, non-tender. BS present. Extremities:  2 + edema present. No cyanosis. No clubbing. CNS: AxOx0, Cranial nerves grossly intact.  Skin: Warm and dry.   Intake/Output from previous day: No intake/output data recorded.    Lab Results: BMET    Component Value Date/Time   NA 146 (H) 11/08/2020 0420   NA 143 11/07/2020 1556   NA 144 11/05/2020 0649   K 4.1 11/08/2020 0420   K 3.7 11/07/2020 1556   K 3.5 11/05/2020 0649   CL 107 11/08/2020 0420   CL 106 11/07/2020 1556   CL 107 11/05/2020 0649   CO2 32 11/08/2020 0420   CO2 31 11/07/2020 1556   CO2 31 11/05/2020 0649   GLUCOSE 116 (H) 11/08/2020 0420   GLUCOSE 118 (H) 11/07/2020 1556   GLUCOSE 134 (H) 11/05/2020 0649   BUN 28 (H) 11/08/2020 0420   BUN 24 (H) 11/07/2020 1556   BUN 17 11/05/2020 0649   CREATININE 0.67 11/08/2020 0420   CREATININE 0.65 11/07/2020 1556   CREATININE 0.62 11/05/2020 0649   CALCIUM 8.8 (L) 11/08/2020 0420   CALCIUM 8.8 (L) 11/07/2020 1556   CALCIUM 8.2 (L) 11/05/2020 0649   GFRNONAA >60 11/08/2020 0420   GFRNONAA >60 11/07/2020 1556   GFRNONAA >60 11/05/2020 0649   CBC    Component Value Date/Time   WBC 13.8 (H) 11/08/2020 0420   RBC 2.60 (L) 11/08/2020 0420   HGB  7.4 (L) 11/08/2020 0420   HCT 26.0 (L) 11/08/2020 0420   PLT 209 11/08/2020 0420   MCV 100.0 11/08/2020 0420   MCH 28.5 11/08/2020 0420   MCHC 28.5 (L) 11/08/2020 0420   RDW 21.2 (H) 11/08/2020 0420   HEPATIC Function Panel Recent Labs    11/03/20 1041  PROT 6.7   HEMOGLOBIN A1C No components found for: HGA1C,  MPG CARDIAC ENZYMES No results found for: CKTOTAL, CKMB, CKMBINDEX, TROPONINI BNP No results for input(s): PROBNP in the last 8760 hours. TSH Recent Labs    11/03/20 1041  TSH 0.862   CHOLESTEROL No results for input(s): CHOL in the last 8760 hours.  Scheduled Meds: Continuous Infusions: PRN Meds:.  Assessment/Plan: Acute on chronic respiratory failure with hypoxia ARDS Atrial fibrillation with RVR, improving S/P tracheostomy S/P cavitary pneumonia  Add diltiazem for heart rate control as LV function is fair with LVH.   LOS: 0 days   Time spent including chart review, lab review, examination, discussion with patient/Nurse : 30 min   Orpah Cobb  MD  11/08/2020, 10:22 AM

## 2020-11-08 NOTE — Progress Notes (Signed)
Pulmonary Critical Care Medicine [facility] [place]   PULMONARY CRITICAL CARE SERVICE  PROGRESS NOTE  Date of Service: 11/08/2020  Michael Massey  YYT:[id number]  DOB: 01-24-1951   DOA: 11/09/2020  Referring Physician: Carron Curie, MD  HPI: Michael Massey is a 69 y.o. male seen for follow up of Acute on Chronic Respiratory Failure.  Patient is on assist control has been on 45% FiO2  Medications: Reviewed on Rounds  Physical Exam:  Vitals: Temperature is 98.3 pulse 96 respiratory rate 35 blood pressure is 121/57 saturations 96%  Ventilator Settings on assist control FiO2 is 45% tidal volume 613 PEEP seven  . General: Comfortable at this time . Eyes: Grossly normal lids, irises & conjunctiva . ENT: grossly tongue is normal . Neck: no obvious mass . Cardiovascular: S1 S2 normal no gallop . Respiratory: No rhonchi coarse breath sounds . Abdomen: soft . Skin: no rash seen on limited exam . Musculoskeletal: not rigid . Psychiatric:unable to assess . Neurologic: no seizure no involuntary movements         Lab Data:   Basic Metabolic Panel: Recent Labs  Lab 11/03/20 1041 11/04/20 0512 11/05/20 0649 11/07/20 1556 11/08/20 0420  NA 148* 147* 144 143 146*  K 3.9 3.4* 3.5 3.7 4.1  CL 108 106 107 106 107  CO2 33* 34* 31 31 32  GLUCOSE 115* 134* 134* 118* 116*  BUN 20 19 17  24* 28*  CREATININE 0.77 0.73 0.62 0.65 0.67  CALCIUM 8.4* 8.4* 8.2* 8.8* 8.8*  MG  --  2.1 2.0 2.0  --   PHOS  --  3.2 2.7  --   --     ABG: Recent Labs  Lab 11/01/2020 2211 11/03/20 0040 11/03/20 0541  PHART 7.197* 7.254* 7.354  PCO2ART 87.5* 77.4* 62.3*  PO2ART 105 115* 78.6*  HCO3 32.7* 33.1* 33.8*  O2SAT 96.6 98.1 95.9    Liver Function Tests: Recent Labs  Lab 11/03/20 1041  AST 48*  ALT 52*  ALKPHOS 75  BILITOT 0.5  PROT 6.7  ALBUMIN 1.6*   No results for input(s): LIPASE, AMYLASE in the last 168 hours. No results for input(s): AMMONIA in the last 168  hours.  CBC: Recent Labs  Lab 11/03/20 1041 11/04/20 0512 11/05/20 0649 11/08/20 0420  WBC 12.4* 12.5* 13.5* 13.8*  HGB 7.4* 7.6* 7.6* 7.4*  HCT 26.2* 27.0* 26.7* 26.0*  MCV 98.9 98.9 98.5 100.0  PLT 210 205 180 209    Cardiac Enzymes: No results for input(s): CKTOTAL, CKMB, CKMBINDEX, TROPONINI in the last 168 hours.  BNP (last 3 results) No results for input(s): BNP in the last 8760 hours.  ProBNP (last 3 results) No results for input(s): PROBNP in the last 8760 hours.  Radiological Exams: ECHOCARDIOGRAM COMPLETE  Result Date: 11/07/2020    ECHOCARDIOGRAM REPORT   Patient Name:   Michael Massey Date of Exam: 11/07/2020 Medical Rec #:  11/09/2020   Height: Accession #:    [id number]  Weight: Date of Birth:  1951/07/05   BSA: Patient Age:    69 years    BP:           101/60 mmHg Patient Gender: M           HR:           119 bpm. Exam Location:  Inpatient Procedure: 2D Echo, Cardiac Doppler and Color Doppler Indications:    Atrial fibrillation  History:        Patient has no prior history of  Echocardiogram examinations.                 Risk Factors:Hypertension. GERD. Pleural effusions.  Sonographer:    Ross Ludwig RDCS (AE) Referring Phys: [id number]  Sonographer Comments: Echo performed with patient supine and on artificial respirator. IMPRESSIONS  1. Left ventricular ejection fraction, by estimation, is 50 to 55%. The left ventricle has low normal function. The left ventricle has no regional wall motion abnormalities. There is mild concentric left ventricular hypertrophy. Left ventricular diastolic parameters are indeterminate.  2. Right ventricular systolic function is mildly reduced. The right ventricular size is mildly enlarged.  3. Left atrial size was mildly dilated.  4. Right atrial size was mildly dilated.  5. The mitral valve is degenerative. Mild mitral valve regurgitation. There is mild prolapse of of the mitral valve.  6. The aortic valve is tricuspid. There is mild  calcification of the aortic valve. There is mild thickening of the aortic valve. Aortic valve regurgitation is not visualized. Mild aortic valve sclerosis is present, with no evidence of aortic valve stenosis.  7. The inferior vena cava is dilated in size with <50% respiratory variability, suggesting right atrial pressure of 15 mmHg. FINDINGS  Left Ventricle: Left ventricular ejection fraction, by estimation, is 50 to 55%. The left ventricle has low normal function. The left ventricle has no regional wall motion abnormalities. The left ventricular internal cavity size was normal in size. There is mild concentric left ventricular hypertrophy. Left ventricular diastolic parameters are indeterminate. Right Ventricle: Prominent. The right ventricular size is mildly enlarged. No increase in right ventricular wall thickness. Right ventricular systolic function is mildly reduced. Left Atrium: Left atrial size was mildly dilated. Right Atrium: Right atrial size was mildly dilated. Pericardium: There is no evidence of pericardial effusion. Mitral Valve: The mitral valve is degenerative in appearance. There is mild prolapse of of the mitral valve. Mild mitral annular calcification. Mild mitral valve regurgitation. Tricuspid Valve: The tricuspid valve is normal in structure. Tricuspid valve regurgitation is mild. Aortic Valve: The aortic valve is tricuspid. There is mild calcification of the aortic valve. There is mild thickening of the aortic valve. Aortic valve regurgitation is not visualized. Mild aortic valve sclerosis is present, with no evidence of aortic valve stenosis. Aortic valve mean gradient measures 3.0 mmHg. Aortic valve peak gradient measures 6.2 mmHg. Aortic valve area, by VTI measures 3.07 cm. Pulmonic Valve: The pulmonic valve was normal in structure. Pulmonic valve regurgitation is not visualized. Aorta: The aortic root is normal in size and structure. There is minimal (Grade I) plaque. Venous: The inferior  vena cava is dilated in size with less than 50% respiratory variability, suggesting right atrial pressure of 15 mmHg. IAS/Shunts: The interatrial septum was not assessed.  LEFT VENTRICLE PLAX 2D LVIDd:         4.50 cm LVIDs:         3.60 cm LV PW:         1.40 cm LV IVS:        1.70 cm LVOT diam:     2.00 cm LV SV:         53 LVOT Area:     3.14 cm  RIGHT VENTRICLE             IVC RV Basal diam:  3.30 cm     IVC diam: 3.20 cm RV S prime:     11.10 cm/s TAPSE (M-mode): 2.1 cm LEFT ATRIUM  RIGHT ATRIUM LA diam:        3.10 cm RA Area:     18.50 cm LA Vol (A2C):   40.2 ml RA Volume:   47.60 ml LA Vol (A4C):   41.4 ml LA Biplane Vol: 43.5 ml  AORTIC VALVE AV Area (Vmax):    2.94 cm AV Area (Vmean):   2.80 cm AV Area (VTI):     3.07 cm AV Vmax:           124.33 cm/s AV Vmean:          81.867 cm/s AV VTI:            0.171 m AV Peak Grad:      6.2 mmHg AV Mean Grad:      3.0 mmHg LVOT Vmax:         116.33 cm/s LVOT Vmean:        72.933 cm/s LVOT VTI:          0.167 m LVOT/AV VTI ratio: 0.98  AORTA Ao Root diam: 3.80 cm  SHUNTS Systemic VTI:  0.17 m Systemic Diam: 2.00 cm Orpah Cobb MD Electronically signed by Orpah Cobb MD Signature Date/Time: 11/07/2020/11:05:21 PM    Final     Assessment/Plan Active Problems:   Acute on chronic respiratory failure with hypoxia (HCC)   Paroxysmal atrial fibrillation (HCC)   Cavitary pneumonia   Tracheostomy status (HCC)   1. Acute on chronic respiratory failure hypoxia we will continue with assist control mode titrate oxygen continue pulmonary toilet. 2. Paroxysmal atrial fibrillation rate is controlled we will continue to follow 3. Cavitary pneumonia treated we will continue present management 4. Tracheostomy remains in place   I have personally seen and evaluated the patient, evaluated laboratory and imaging results, formulated the assessment and plan and placed orders. The Patient requires high complexity decision making with multiple systems  involvement.  Rounds were done with the Respiratory Therapy Director and Staff therapists and discussed with nursing staff also.  Yevonne Pax, MD [facility] Pulmonary Critical Care Medicine Sleep Medicine

## 2020-11-09 DIAGNOSIS — I480 Paroxysmal atrial fibrillation: Secondary | ICD-10-CM | POA: Diagnosis not present

## 2020-11-09 DIAGNOSIS — J189 Pneumonia, unspecified organism: Secondary | ICD-10-CM | POA: Diagnosis not present

## 2020-11-09 DIAGNOSIS — Z930 Tracheostomy status: Secondary | ICD-10-CM | POA: Diagnosis not present

## 2020-11-09 DIAGNOSIS — J9621 Acute and chronic respiratory failure with hypoxia: Secondary | ICD-10-CM | POA: Diagnosis not present

## 2020-11-09 LAB — BASIC METABOLIC PANEL
Anion gap: 6 (ref 5–15)
BUN: 32 mg/dL — ABNORMAL HIGH (ref 8–23)
CO2: 33 mmol/L — ABNORMAL HIGH (ref 22–32)
Calcium: 9 mg/dL (ref 8.9–10.3)
Chloride: 109 mmol/L (ref 98–111)
Creatinine, Ser: 0.61 mg/dL (ref 0.61–1.24)
GFR, Estimated: >60 mL/min (ref >60)
Glucose, Bld: 129 mg/dL — ABNORMAL HIGH (ref 70–99)
Potassium: 3.9 mmol/L (ref 3.5–5.1)
Sodium: 148 mmol/L — ABNORMAL HIGH (ref 135–145)

## 2020-11-09 NOTE — Progress Notes (Signed)
Pulmonary Critical Care Medicine [facility] [place]   PULMONARY CRITICAL CARE SERVICE  PROGRESS NOTE  Date of Service: 11/09/2020  Michael Massey  QPR:[id number]  DOB: 08-10-1951   DOA: 11-21-20  Referring Physician: Carron Curie, MD  HPI: Michael Massey is a 69 y.o. male seen for follow up of Acute on Chronic Respiratory Failure.  Patient currently is on full support on the ventilator respiratory therapy is going to try on his pressure support today  Medications: Reviewed on Rounds  Physical Exam:  Vitals: Temperature 97.2 pulse 78 respiratory rate 30 blood pressure is 115/62 saturations 97%  Ventilator Settings assist control FiO2 35% tidal volume 530 PEEP 7  . General: Comfortable at this time . Eyes: Grossly normal lids, irises & conjunctiva . ENT: grossly tongue is normal . Neck: no obvious mass . Cardiovascular: S1 S2 normal no gallop . Respiratory: No rhonchi very coarse breath sounds . Abdomen: soft . Skin: no rash seen on limited exam . Musculoskeletal: not rigid . Psychiatric:unable to assess . Neurologic: no seizure no involuntary movements         Lab Data:   Basic Metabolic Panel: Recent Labs  Lab 11/04/20 0512 11/05/20 0649 11/07/20 1556 11/08/20 0420 11/09/20 0342  NA 147* 144 143 146* 148*  K 3.4* 3.5 3.7 4.1 3.9  CL 106 107 106 107 109  CO2 34* 31 31 32 33*  GLUCOSE 134* 134* 118* 116* 129*  BUN 19 17 24* 28* 32*  CREATININE 0.73 0.62 0.65 0.67 0.61  CALCIUM 8.4* 8.2* 8.8* 8.8* 9.0  MG 2.1 2.0 2.0  --   --   PHOS 3.2 2.7  --   --   --     ABG: Recent Labs  Lab 11/21/20 2211 11/03/20 0040 11/03/20 0541  PHART 7.197* 7.254* 7.354  PCO2ART 87.5* 77.4* 62.3*  PO2ART 105 115* 78.6*  HCO3 32.7* 33.1* 33.8*  O2SAT 96.6 98.1 95.9    Liver Function Tests: Recent Labs  Lab 11/03/20 1041  AST 48*  ALT 52*  ALKPHOS 75  BILITOT 0.5  PROT 6.7  ALBUMIN 1.6*   No results for input(s): LIPASE, AMYLASE in the last 168 hours. No  results for input(s): AMMONIA in the last 168 hours.  CBC: Recent Labs  Lab 11/03/20 1041 11/04/20 0512 11/05/20 0649 11/08/20 0420  WBC 12.4* 12.5* 13.5* 13.8*  HGB 7.4* 7.6* 7.6* 7.4*  HCT 26.2* 27.0* 26.7* 26.0*  MCV 98.9 98.9 98.5 100.0  PLT 210 205 180 209    Cardiac Enzymes: No results for input(s): CKTOTAL, CKMB, CKMBINDEX, TROPONINI in the last 168 hours.  BNP (last 3 results) No results for input(s): BNP in the last 8760 hours.  ProBNP (last 3 results) No results for input(s): PROBNP in the last 8760 hours.  Radiological Exams: ECHOCARDIOGRAM COMPLETE  Result Date: 11/07/2020    ECHOCARDIOGRAM REPORT   Patient Name:   Michael Massey Date of Exam: 11/07/2020 Medical Rec #:  [id number]   Height: Accession #:    [id number]  Weight: Date of Birth:  1951-01-05   BSA: Patient Age:    69 years    BP:           101/60 mmHg Patient Gender: M           HR:           119 bpm. Exam Location:  Inpatient Procedure: 2D Echo, Cardiac Doppler and Color Doppler Indications:    Atrial fibrillation  History:  Patient has no prior history of Echocardiogram examinations.                 Risk Factors:Hypertension. GERD. Pleural effusions.  Sonographer:    Ross Ludwig RDCS (AE) Referring Phys: [address]  Sonographer Comments: Echo performed with patient supine and on artificial respirator. IMPRESSIONS  1. Left ventricular ejection fraction, by estimation, is 50 to 55%. The left ventricle has low normal function. The left ventricle has no regional wall motion abnormalities. There is mild concentric left ventricular hypertrophy. Left ventricular diastolic parameters are indeterminate.  2. Right ventricular systolic function is mildly reduced. The right ventricular size is mildly enlarged.  3. Left atrial size was mildly dilated.  4. Right atrial size was mildly dilated.  5. The mitral valve is degenerative. Mild mitral valve regurgitation. There is mild prolapse of of the mitral valve.  6. The  aortic valve is tricuspid. There is mild calcification of the aortic valve. There is mild thickening of the aortic valve. Aortic valve regurgitation is not visualized. Mild aortic valve sclerosis is present, with no evidence of aortic valve stenosis.  7. The inferior vena cava is dilated in size with <50% respiratory variability, suggesting right atrial pressure of 15 mmHg. FINDINGS  Left Ventricle: Left ventricular ejection fraction, by estimation, is 50 to 55%. The left ventricle has low normal function. The left ventricle has no regional wall motion abnormalities. The left ventricular internal cavity size was normal in size. There is mild concentric left ventricular hypertrophy. Left ventricular diastolic parameters are indeterminate. Right Ventricle: Prominent. The right ventricular size is mildly enlarged. No increase in right ventricular wall thickness. Right ventricular systolic function is mildly reduced. Left Atrium: Left atrial size was mildly dilated. Right Atrium: Right atrial size was mildly dilated. Pericardium: There is no evidence of pericardial effusion. Mitral Valve: The mitral valve is degenerative in appearance. There is mild prolapse of of the mitral valve. Mild mitral annular calcification. Mild mitral valve regurgitation. Tricuspid Valve: The tricuspid valve is normal in structure. Tricuspid valve regurgitation is mild. Aortic Valve: The aortic valve is tricuspid. There is mild calcification of the aortic valve. There is mild thickening of the aortic valve. Aortic valve regurgitation is not visualized. Mild aortic valve sclerosis is present, with no evidence of aortic valve stenosis. Aortic valve mean gradient measures 3.0 mmHg. Aortic valve peak gradient measures 6.2 mmHg. Aortic valve area, by VTI measures 3.07 cm. Pulmonic Valve: The pulmonic valve was normal in structure. Pulmonic valve regurgitation is not visualized. Aorta: The aortic root is normal in size and structure. There is  minimal (Grade I) plaque. Venous: The inferior vena cava is dilated in size with less than 50% respiratory variability, suggesting right atrial pressure of 15 mmHg. IAS/Shunts: The interatrial septum was not assessed.  LEFT VENTRICLE PLAX 2D LVIDd:         4.50 cm LVIDs:         3.60 cm LV PW:         1.40 cm LV IVS:        1.70 cm LVOT diam:     2.00 cm LV SV:         53 LVOT Area:     3.14 cm  RIGHT VENTRICLE             IVC RV Basal diam:  3.30 cm     IVC diam: 3.20 cm RV S prime:     11.10 cm/s TAPSE (M-mode): 2.1 cm LEFT  ATRIUM             RIGHT ATRIUM LA diam:        3.10 cm RA Area:     18.50 cm LA Vol (A2C):   40.2 ml RA Volume:   47.60 ml LA Vol (A4C):   41.4 ml LA Biplane Vol: 43.5 ml  AORTIC VALVE AV Area (Vmax):    2.94 cm AV Area (Vmean):   2.80 cm AV Area (VTI):     3.07 cm AV Vmax:           124.33 cm/s AV Vmean:          81.867 cm/s AV VTI:            0.171 m AV Peak Grad:      6.2 mmHg AV Mean Grad:      3.0 mmHg LVOT Vmax:         116.33 cm/s LVOT Vmean:        72.933 cm/s LVOT VTI:          0.167 m LVOT/AV VTI ratio: 0.98  AORTA Ao Root diam: 3.80 cm  SHUNTS Systemic VTI:  0.17 m Systemic Diam: 2.00 cm Orpah Cobb MD Electronically signed by Orpah Cobb MD Signature Date/Time: 11/07/2020/11:05:21 PM    Final     Assessment/Plan Active Problems:   Acute on chronic respiratory failure with hypoxia (HCC)   Paroxysmal atrial fibrillation (HCC)   Cavitary pneumonia   Tracheostomy status (HCC)   1. Acute on chronic respiratory failure hypoxia we will continue with try to wean on pressure support today 2. Paroxysmal atrial fibrillation rate is controlled 3. Cavitary pneumonia treated we will monitor 4. Tracheostomy remains in place at this time   I have personally seen and evaluated the patient, evaluated laboratory and imaging results, formulated the assessment and plan and placed orders. The Patient requires high complexity decision making with multiple systems involvement.   Rounds were done with the Respiratory Therapy Director and Staff therapists and discussed with nursing staff also.  Yevonne Pax, MD [facility] Pulmonary Critical Care Medicine Sleep Medicine

## 2020-11-10 ENCOUNTER — Other Ambulatory Visit (HOSPITAL_COMMUNITY): Payer: Medicare Other

## 2020-11-10 DIAGNOSIS — I480 Paroxysmal atrial fibrillation: Secondary | ICD-10-CM | POA: Diagnosis not present

## 2020-11-10 DIAGNOSIS — J9621 Acute and chronic respiratory failure with hypoxia: Secondary | ICD-10-CM | POA: Diagnosis not present

## 2020-11-10 DIAGNOSIS — J189 Pneumonia, unspecified organism: Secondary | ICD-10-CM | POA: Diagnosis not present

## 2020-11-10 DIAGNOSIS — Z930 Tracheostomy status: Secondary | ICD-10-CM | POA: Diagnosis not present

## 2020-11-10 LAB — CBC
HCT: 25.1 % — ABNORMAL LOW (ref 39.0–52.0)
Hemoglobin: 7.3 g/dL — ABNORMAL LOW (ref 13.0–17.0)
MCH: 29.3 pg (ref 26.0–34.0)
MCHC: 29.1 g/dL — ABNORMAL LOW (ref 30.0–36.0)
MCV: 100.8 fL — ABNORMAL HIGH (ref 80.0–100.0)
Platelets: 253 10*3/uL (ref 150–400)
RBC: 2.49 MIL/uL — ABNORMAL LOW (ref 4.22–5.81)
RDW: 21.4 % — ABNORMAL HIGH (ref 11.5–15.5)
WBC: 12.4 10*3/uL — ABNORMAL HIGH (ref 4.0–10.5)
nRBC: 0.2 % (ref 0.0–0.2)

## 2020-11-10 LAB — BASIC METABOLIC PANEL
Anion gap: 6 (ref 5–15)
BUN: 35 mg/dL — ABNORMAL HIGH (ref 8–23)
CO2: 34 mmol/L — ABNORMAL HIGH (ref 22–32)
Calcium: 9.1 mg/dL (ref 8.9–10.3)
Chloride: 108 mmol/L (ref 98–111)
Creatinine, Ser: 0.6 mg/dL — ABNORMAL LOW (ref 0.61–1.24)
GFR, Estimated: >60 mL/min (ref >60)
Glucose, Bld: 137 mg/dL — ABNORMAL HIGH (ref 70–99)
Potassium: 3.8 mmol/L (ref 3.5–5.1)
Sodium: 148 mmol/L — ABNORMAL HIGH (ref 135–145)

## 2020-11-10 NOTE — Progress Notes (Signed)
Pulmonary Critical Care Medicine [facility] [place]   PULMONARY CRITICAL CARE SERVICE  PROGRESS NOTE  Date of Service: 11/10/2020  Michael Massey  YQM:[id number]  DOB: Nov 08, 1951   DOA: 10/26/2020  Referring Physician: Carron Curie, MD  HPI: Michael Massey is a 69 y.o. male seen for follow up of Acute on Chronic Respiratory Failure.  Patient currently is on wean pressure support requiring 45% FiO2 goal of 12 hours  Medications: Reviewed on Rounds  Physical Exam:  Vitals: Temperature is 98.3 pulse 100 respiratory rate 16 blood pressure is 118/66 saturations 99%  Ventilator Settings on pressure support FiO2 is 45% tidal volume 660 pressure of 12/5  . General: Comfortable at this time . Eyes: Grossly normal lids, irises & conjunctiva . ENT: grossly tongue is normal . Neck: no obvious mass . Cardiovascular: S1 S2 normal no gallop . Respiratory: No rhonchi no rales are noted at this time . Abdomen: soft . Skin: no rash seen on limited exam . Musculoskeletal: not rigid . Psychiatric:unable to assess . Neurologic: no seizure no involuntary movements         Lab Data:   Basic Metabolic Panel: Recent Labs  Lab 11/04/20 0512 11/04/20 0512 11/05/20 0649 11/07/20 1556 11/08/20 0420 11/09/20 0342 11/10/20 0459  NA 147*   < > 144 143 146* 148* 148*  K 3.4*   < > 3.5 3.7 4.1 3.9 3.8  CL 106   < > 107 106 107 109 108  CO2 34*   < > 31 31 32 33* 34*  GLUCOSE 134*   < > 134* 118* 116* 129* 137*  BUN 19   < > 17 24* 28* 32* 35*  CREATININE 0.73   < > 0.62 0.65 0.67 0.61 0.60*  CALCIUM 8.4*   < > 8.2* 8.8* 8.8* 9.0 9.1  MG 2.1  --  2.0 2.0  --   --   --   PHOS 3.2  --  2.7  --   --   --   --    < > = values in this interval not displayed.    ABG: No results for input(s): PHART, PCO2ART, PO2ART, HCO3, O2SAT in the last 168 hours.  Liver Function Tests: No results for input(s): AST, ALT, ALKPHOS, BILITOT, PROT, ALBUMIN in the last 168 hours. No results for input(s):  LIPASE, AMYLASE in the last 168 hours. No results for input(s): AMMONIA in the last 168 hours.  CBC: Recent Labs  Lab 11/04/20 0512 11/05/20 0649 11/08/20 0420 11/10/20 0459  WBC 12.5* 13.5* 13.8* 12.4*  HGB 7.6* 7.6* 7.4* 7.3*  HCT 27.0* 26.7* 26.0* 25.1*  MCV 98.9 98.5 100.0 100.8*  PLT 205 180 209 253    Cardiac Enzymes: No results for input(s): CKTOTAL, CKMB, CKMBINDEX, TROPONINI in the last 168 hours.  BNP (last 3 results) No results for input(s): BNP in the last 8760 hours.  ProBNP (last 3 results) No results for input(s): PROBNP in the last 8760 hours.  Radiological Exams: No results found.  Assessment/Plan Active Problems:   Acute on chronic respiratory failure with hypoxia (HCC)   Paroxysmal atrial fibrillation (HCC)   Cavitary pneumonia   Tracheostomy status (HCC)   1. Acute on chronic respiratory failure hypoxia we will continue with the wean goal of 12 hours. 2. Paroxysmal atrial fibrillation rate is controlled 3. Cavitary pneumonia treated 4. Tracheostomy will remain in place   I have personally seen and evaluated the patient, evaluated laboratory and imaging results, formulated the assessment and  plan and placed orders. The Patient requires high complexity decision making with multiple systems involvement.  Rounds were done with the Respiratory Therapy Director and Staff therapists and discussed with nursing staff also.  Allyne Gee, MD [facility] Pulmonary Critical Care Medicine Sleep Medicine

## 2020-11-11 DIAGNOSIS — Z930 Tracheostomy status: Secondary | ICD-10-CM | POA: Diagnosis not present

## 2020-11-11 DIAGNOSIS — J189 Pneumonia, unspecified organism: Secondary | ICD-10-CM | POA: Diagnosis not present

## 2020-11-11 DIAGNOSIS — J9621 Acute and chronic respiratory failure with hypoxia: Secondary | ICD-10-CM | POA: Diagnosis not present

## 2020-11-11 DIAGNOSIS — I480 Paroxysmal atrial fibrillation: Secondary | ICD-10-CM | POA: Diagnosis not present

## 2020-11-11 LAB — URINALYSIS, ROUTINE W REFLEX MICROSCOPIC
Bacteria, UA: NONE SEEN
Bilirubin Urine: NEGATIVE
Glucose, UA: NEGATIVE mg/dL
Hgb urine dipstick: NEGATIVE
Ketones, ur: NEGATIVE mg/dL
Leukocytes,Ua: NEGATIVE
Nitrite: NEGATIVE
Protein, ur: 30 mg/dL — AB
Specific Gravity, Urine: 1.017 (ref 1.005–1.030)
pH: 6 (ref 5.0–8.0)

## 2020-11-11 LAB — SODIUM: Sodium: 148 mmol/L — ABNORMAL HIGH (ref 135–145)

## 2020-11-11 NOTE — Progress Notes (Signed)
Pulmonary Critical Care Medicine [facility] [place]   PULMONARY CRITICAL CARE SERVICE  PROGRESS NOTE  Date of Service: 11/11/2020  Michael Massey  DJS:[id number]  DOB: [birth year]   DOA: 10/31/2020  Referring Physician: Carron Curie, MD  HPI: Michael Massey is a 69 y.o. male seen for follow up of Acute on Chronic Respiratory Failure.  Patient was able to do 3 hours of pressure support however had a low-grade fever noted this morning is back on assist control  Medications: Reviewed on Rounds  Physical Exam:  Vitals: Temperature is 99.9 pulse 111 respiratory 40 blood pressure is 137/70 saturations 97%  Ventilator Settings on assist control FiO2 45% tidal volume 530 PEEP 7  . General: Comfortable at this time . Eyes: Grossly normal lids, irises & conjunctiva . ENT: grossly tongue is normal . Neck: no obvious mass . Cardiovascular: S1 S2 normal no gallop . Respiratory: No rhonchi coarse breath sounds . Abdomen: soft . Skin: no rash seen on limited exam . Musculoskeletal: not rigid . Psychiatric:unable to assess . Neurologic: no seizure no involuntary movements         Lab Data:   Basic Metabolic Panel: Recent Labs  Lab 11/05/20 0649 11/05/20 0649 11/07/20 1556 11/08/20 0420 11/09/20 0342 11/10/20 0459 11/11/20 0557  NA 144   < > 143 146* 148* 148* 148*  K 3.5  --  3.7 4.1 3.9 3.8  --   CL 107  --  106 107 109 108  --   CO2 31  --  31 32 33* 34*  --   GLUCOSE 134*  --  118* 116* 129* 137*  --   BUN 17  --  24* 28* 32* 35*  --   CREATININE 0.62  --  0.65 0.67 0.61 0.60*  --   CALCIUM 8.2*  --  8.8* 8.8* 9.0 9.1  --   MG 2.0  --  2.0  --   --   --   --   PHOS 2.7  --   --   --   --   --   --    < > = values in this interval not displayed.    ABG: No results for input(s): PHART, PCO2ART, PO2ART, HCO3, O2SAT in the last 168 hours.  Liver Function Tests: No results for input(s): AST, ALT, ALKPHOS, BILITOT, PROT, ALBUMIN in the last 168 hours. No results  for input(s): LIPASE, AMYLASE in the last 168 hours. No results for input(s): AMMONIA in the last 168 hours.  CBC: Recent Labs  Lab 11/05/20 0649 11/08/20 0420 11/10/20 0459  WBC 13.5* 13.8* 12.4*  HGB 7.6* 7.4* 7.3*  HCT 26.7* 26.0* 25.1*  MCV 98.5 100.0 100.8*  PLT 180 209 253    Cardiac Enzymes: No results for input(s): CKTOTAL, CKMB, CKMBINDEX, TROPONINI in the last 168 hours.  BNP (last 3 results) No results for input(s): BNP in the last 8760 hours.  ProBNP (last 3 results) No results for input(s): PROBNP in the last 8760 hours.  Radiological Exams: DG Abd Portable 1V  Result Date: 11/10/2020 CLINICAL DATA:  NG tube placement EXAM: PORTABLE ABDOMEN - 1 VIEW COMPARISON:  10/23/2020 FINDINGS: Esophageal tube tip overlies the distal stomach. Airspace disease at the bases. Nonobstructed upper gas pattern. IMPRESSION: Esophageal tube tip overlies the distal stomach. Electronically Signed   By: Jasmine Pang M.D.   On: 11/10/2020 17:07    Assessment/Plan Active Problems:   Acute on chronic respiratory failure with hypoxia (HCC)   Paroxysmal  atrial fibrillation (HCC)   Cavitary pneumonia   Tracheostomy status (HCC)   1. Acute on chronic respiratory failure hypoxia plan is to continue with holding off on a wean today until fever comes down then will reassess 2. Paroxysmal atrial fibrillation rate is controlled being seen by cardiology 3. Cavitary pneumonia treated we will continue with supportive care 4. Tracheostomy remains in place   I have personally seen and evaluated the patient, evaluated laboratory and imaging results, formulated the assessment and plan and placed orders. The Patient requires high complexity decision making with multiple systems involvement.  Rounds were done with the Respiratory Therapy Director and Staff therapists and discussed with nursing staff also.  Yevonne Pax, MD [facility] Pulmonary Critical Care Medicine Sleep Medicine

## 2020-11-12 ENCOUNTER — Other Ambulatory Visit (HOSPITAL_COMMUNITY): Payer: Medicare Other

## 2020-11-12 DIAGNOSIS — Z930 Tracheostomy status: Secondary | ICD-10-CM | POA: Diagnosis not present

## 2020-11-12 DIAGNOSIS — J9621 Acute and chronic respiratory failure with hypoxia: Secondary | ICD-10-CM | POA: Diagnosis not present

## 2020-11-12 DIAGNOSIS — I480 Paroxysmal atrial fibrillation: Secondary | ICD-10-CM | POA: Diagnosis not present

## 2020-11-12 DIAGNOSIS — J189 Pneumonia, unspecified organism: Secondary | ICD-10-CM | POA: Diagnosis not present

## 2020-11-12 LAB — CBC
HCT: 28.3 % — ABNORMAL LOW (ref 39.0–52.0)
Hemoglobin: 8.1 g/dL — ABNORMAL LOW (ref 13.0–17.0)
MCH: 28.2 pg (ref 26.0–34.0)
MCHC: 28.6 g/dL — ABNORMAL LOW (ref 30.0–36.0)
MCV: 98.6 fL (ref 80.0–100.0)
Platelets: 341 10*3/uL (ref 150–400)
RBC: 2.87 MIL/uL — ABNORMAL LOW (ref 4.22–5.81)
RDW: 21.1 % — ABNORMAL HIGH (ref 11.5–15.5)
WBC: 14.7 10*3/uL — ABNORMAL HIGH (ref 4.0–10.5)
nRBC: 0 % (ref 0.0–0.2)

## 2020-11-12 LAB — URINE CULTURE: Culture: NO GROWTH

## 2020-11-12 LAB — BASIC METABOLIC PANEL
Anion gap: 13 (ref 5–15)
BUN: 35 mg/dL — ABNORMAL HIGH (ref 8–23)
CO2: 32 mmol/L (ref 22–32)
Calcium: 9.3 mg/dL (ref 8.9–10.3)
Chloride: 103 mmol/L (ref 98–111)
Creatinine, Ser: 0.53 mg/dL — ABNORMAL LOW (ref 0.61–1.24)
GFR, Estimated: >60 mL/min (ref >60)
Glucose, Bld: 115 mg/dL — ABNORMAL HIGH (ref 70–99)
Potassium: 4.1 mmol/L (ref 3.5–5.1)
Sodium: 148 mmol/L — ABNORMAL HIGH (ref 135–145)

## 2020-11-12 NOTE — Progress Notes (Signed)
Pulmonary Critical Care Medicine [facility] [place]   PULMONARY CRITICAL CARE SERVICE  PROGRESS NOTE  Date of Service: 11/12/2020  Michael Massey  IEP:[id number]  DOB: 14-Oct-1951   DOA: 10/23/2020  Referring Physician: Carron Curie, MD  HPI: Michael Massey is a 69 y.o. male seen for follow up of Acute on Chronic Respiratory Failure.  Remains on the ventilator try pressure support today for about 12 hours  Medications: Reviewed on Rounds  Physical Exam:  Vitals: Temperature is 97.8 pulse 82 respiratory rate 25 blood pressure is 101/57 saturations 100%  Ventilator Settings on assist control FiO2 45% tidal volume 440 PEEP 7  . General: Comfortable at this time . Eyes: Grossly normal lids, irises & conjunctiva . ENT: grossly tongue is normal . Neck: no obvious mass . Cardiovascular: S1 S2 normal no gallop . Respiratory: No rhonchi very coarse breath sounds . Abdomen: soft . Skin: no rash seen on limited exam . Musculoskeletal: not rigid . Psychiatric:unable to assess . Neurologic: no seizure no involuntary movements         Lab Data:   Basic Metabolic Panel: Recent Labs  Lab 11/07/20 1556 11/07/20 1556 11/08/20 0420 11/09/20 0342 11/10/20 0459 11/11/20 0557 11/12/20 0627  NA 143   < > 146* 148* 148* 148* 148*  K 3.7  --  4.1 3.9 3.8  --  4.1  CL 106  --  107 109 108  --  103  CO2 31  --  32 33* 34*  --  32  GLUCOSE 118*  --  116* 129* 137*  --  115*  BUN 24*  --  28* 32* 35*  --  35*  CREATININE 0.65  --  0.67 0.61 0.60*  --  0.53*  CALCIUM 8.8*  --  8.8* 9.0 9.1  --  9.3  MG 2.0  --   --   --   --   --   --    < > = values in this interval not displayed.    ABG: No results for input(s): PHART, PCO2ART, PO2ART, HCO3, O2SAT in the last 168 hours.  Liver Function Tests: No results for input(s): AST, ALT, ALKPHOS, BILITOT, PROT, ALBUMIN in the last 168 hours. No results for input(s): LIPASE, AMYLASE in the last 168 hours. No results for input(s):  AMMONIA in the last 168 hours.  CBC: Recent Labs  Lab 11/08/20 0420 11/10/20 0459 11/12/20 0627  WBC 13.8* 12.4* 14.7*  HGB 7.4* 7.3* 8.1*  HCT 26.0* 25.1* 28.3*  MCV 100.0 100.8* 98.6  PLT 209 253 341    Cardiac Enzymes: No results for input(s): CKTOTAL, CKMB, CKMBINDEX, TROPONINI in the last 168 hours.  BNP (last 3 results) No results for input(s): BNP in the last 8760 hours.  ProBNP (last 3 results) No results for input(s): PROBNP in the last 8760 hours.  Radiological Exams: DG CHEST PORT 1 VIEW  Result Date: 11/12/2020 CLINICAL DATA:  Pneumonia and low-grade fever EXAM: PORTABLE CHEST 1 VIEW COMPARISON:  11/11/2020 FINDINGS: An enteric tube remains present. Low lung volumes. Diffuse interstitial process to the lungs may represent coarse fibrosis. No pleural effusions. No pneumothorax. 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 Mediastinal contours appear intact. IMPRESSION: Low lung volumes with diffuse interstitial process to the lungs, likely coarse fibrosis. Electronically Signed   By: Burman Nieves M.D.   On: 11/12/2020 06:43   DG Abd Portable 1V  Result Date: 11/10/2020 CLINICAL DATA:  NG tube placement EXAM: PORTABLE ABDOMEN - 1 VIEW COMPARISON:  2020/11/24 FINDINGS: Esophageal tube tip overlies the distal stomach. Airspace disease at the bases. Nonobstructed upper gas pattern. IMPRESSION: Esophageal tube tip overlies the distal stomach. Electronically Signed   By: Jasmine Pang M.D.   On: 11/10/2020 17:07    Assessment/Plan Active Problems:   Acute on chronic respiratory failure with hypoxia (HCC)   Paroxysmal atrial fibrillation (HCC)   Cavitary pneumonia   Tracheostomy status (HCC)   1. Acute on chronic respiratory failure hypoxia we will continue with the weaning process try pressure support for about 12 hours 2. Paroxysmal atrial fibrillation rate is controlled 3. Cavitary pneumonia no change supportive care 4. Tracheostomy remains in place   I  have personally seen and evaluated the patient, evaluated laboratory and imaging results, formulated the assessment and plan and placed orders. The Patient requires high complexity decision making with multiple systems involvement.  Rounds were done with the Respiratory Therapy Director and Staff therapists and discussed with nursing staff also.  Yevonne Pax, MD [facility] Pulmonary Critical Care Medicine Sleep Medicine

## 2020-11-13 ENCOUNTER — Other Ambulatory Visit (HOSPITAL_COMMUNITY): Payer: Medicare Other

## 2020-11-13 DIAGNOSIS — I480 Paroxysmal atrial fibrillation: Secondary | ICD-10-CM | POA: Diagnosis not present

## 2020-11-13 DIAGNOSIS — J189 Pneumonia, unspecified organism: Secondary | ICD-10-CM | POA: Diagnosis not present

## 2020-11-13 DIAGNOSIS — Z930 Tracheostomy status: Secondary | ICD-10-CM | POA: Diagnosis not present

## 2020-11-13 DIAGNOSIS — J9621 Acute and chronic respiratory failure with hypoxia: Secondary | ICD-10-CM | POA: Diagnosis not present

## 2020-11-13 LAB — CULTURE, RESPIRATORY W GRAM STAIN: Culture: NORMAL

## 2020-11-13 LAB — CBC
HCT: 26.7 % — ABNORMAL LOW (ref 39.0–52.0)
Hemoglobin: 7.8 g/dL — ABNORMAL LOW (ref 13.0–17.0)
MCH: 28.3 pg (ref 26.0–34.0)
MCHC: 29.2 g/dL — ABNORMAL LOW (ref 30.0–36.0)
MCV: 96.7 fL (ref 80.0–100.0)
Platelets: 362 10*3/uL (ref 150–400)
RBC: 2.76 MIL/uL — ABNORMAL LOW (ref 4.22–5.81)
RDW: 20.8 % — ABNORMAL HIGH (ref 11.5–15.5)
WBC: 13.5 10*3/uL — ABNORMAL HIGH (ref 4.0–10.5)
nRBC: 0 % (ref 0.0–0.2)

## 2020-11-13 LAB — BASIC METABOLIC PANEL WITH GFR
Anion gap: 10 (ref 5–15)
BUN: 26 mg/dL — ABNORMAL HIGH (ref 8–23)
CO2: 35 mmol/L — ABNORMAL HIGH (ref 22–32)
Calcium: 8.9 mg/dL (ref 8.9–10.3)
Chloride: 98 mmol/L (ref 98–111)
Creatinine, Ser: 0.5 mg/dL — ABNORMAL LOW (ref 0.61–1.24)
GFR, Estimated: >60 mL/min (ref >60)
Glucose, Bld: 113 mg/dL — ABNORMAL HIGH (ref 70–99)
Potassium: 3.8 mmol/L (ref 3.5–5.1)
Sodium: 143 mmol/L (ref 135–145)

## 2020-11-13 LAB — MAGNESIUM: Magnesium: 2 mg/dL (ref 1.7–2.4)

## 2020-11-13 LAB — RESP PANEL BY RT-PCR (RSV, FLU A&B, COVID)  RVPGX2
Influenza A by PCR: NEGATIVE
Influenza B by PCR: NEGATIVE
Resp Syncytial Virus by PCR: NEGATIVE
SARS Coronavirus 2 by RT PCR: NEGATIVE

## 2020-11-13 NOTE — Progress Notes (Signed)
Pulmonary Critical Care Medicine [facility]   PULMONARY CRITICAL CARE SERVICE  PROGRESS NOTE  Date of Service: 11/13/2020  Michael Massey  HAL:[id number]  DOB: 25-Aug-1951   DOA: 11/13/2020  Referring Physician: Carron Curie, MD  HPI: Michael Massey is a 69 y.o. male seen for follow up of Acute on Chronic Respiratory Failure.  Was attempted at weaning however patient failed placed back on assist control mode  Medications: Reviewed on Rounds  Physical Exam:  Vitals: Temperature is 99.5 pulse 75 respiratory rate 30 blood pressure is 118/70 saturations 95%  Ventilator Settings on assist control FiO2 45% tidal volume 530 PEEP 7  . General: Comfortable at this time . Eyes: Grossly normal lids, irises & conjunctiva . ENT: grossly tongue is normal . Neck: no obvious mass . Cardiovascular: S1 S2 normal no gallop . Respiratory: No rhonchi very coarse breath sounds . Abdomen: soft . Skin: no rash seen on limited exam . Musculoskeletal: not rigid . Psychiatric:unable to assess . Neurologic: no seizure no involuntary movements         Lab Data:   Basic Metabolic Panel: Recent Labs  Lab 11/07/20 1556 11/07/20 1556 11/08/20 0420 11/08/20 0420 11/09/20 0342 11/10/20 0459 11/11/20 0557 11/12/20 0627 11/13/20 0410  NA 143   < > 146*   < > 148* 148* 148* 148* 143  K 3.7   < > 4.1  --  3.9 3.8  --  4.1 3.8  CL 106   < > 107  --  109 108  --  103 98  CO2 31   < > 32  --  33* 34*  --  32 35*  GLUCOSE 118*   < > 116*  --  129* 137*  --  115* 113*  BUN 24*   < > 28*  --  32* 35*  --  35* 26*  CREATININE 0.65   < > 0.67  --  0.61 0.60*  --  0.53* 0.50*  CALCIUM 8.8*   < > 8.8*  --  9.0 9.1  --  9.3 8.9  MG 2.0  --   --   --   --   --   --   --  2.0   < > = values in this interval not displayed.    ABG: No results for input(s): PHART, PCO2ART, PO2ART, HCO3, O2SAT in the last 168 hours.  Liver Function Tests: No results for input(s): AST, ALT, ALKPHOS, BILITOT,  PROT, ALBUMIN in the last 168 hours. No results for input(s): LIPASE, AMYLASE in the last 168 hours. No results for input(s): AMMONIA in the last 168 hours.  CBC: Recent Labs  Lab 11/08/20 0420 11/10/20 0459 11/12/20 0627 11/13/20 0410  WBC 13.8* 12.4* 14.7* 13.5*  HGB 7.4* 7.3* 8.1* 7.8*  HCT 26.0* 25.1* 28.3* 26.7*  MCV 100.0 100.8* 98.6 96.7  PLT 209 253 341 362    Cardiac Enzymes: No results for input(s): CKTOTAL, CKMB, CKMBINDEX, TROPONINI in the last 168 hours.  BNP (last 3 results) No results for input(s): BNP in the last 8760 hours.  ProBNP (last 3 results) No results for input(s): PROBNP in the last 8760 hours.  Radiological Exams: CT ABDOMEN WO CONTRAST  Result Date: 11/13/2020 CLINICAL DATA:  Evaluate anatomy prior to potential percutaneous gastrostomy tube placement. EXAM: CT ABDOMEN WITHOUT CONTRAST TECHNIQUE: Multidetector CT imaging of the abdomen was performed following the standard protocol without IV contrast. COMPARISON:  Chest CT-11/03/2020 FINDINGS: Lower chest: Limited visualization of the lower  thorax demonstrates loculated hydropneumothorax involving posteromedial aspect of the left hemithorax. Advanced paraseptal emphysematous change and extensive bilateral ground-glass and consolidative opacities with associated air bronchograms. Normal heart size. Coronary artery calcifications. There is diffuse decreased attenuation of the intra cardiac blood pool suggestive of anemia. Hepatobiliary: Mild nodularity of the hepatic contour. Normal noncontrast appearance of the gallbladder given degree of distention. No radiopaque gallstones. No ascites. Pancreas: Normal noncontrast appearance of the pancreas. Spleen: Normal noncontrast appearance of the spleen. Adrenals/Urinary Tract: Punctate (1-2 mm) nonobstructing renal stones are seen bilaterally (right-image 50, series 3; left-image 48, series 3). There is a minimal amount of symmetric likely age and body habitus related  perinephric stranding. No urinary obstruction. Note made of 2 partially exophytic left-sided renal lesions, largest of which measures 2 cm in diameter (image 40, series 3), incompletely characterized without intravenous contrast though potentially representative of renal cysts. Mild thickening the bilateral adrenal glands without discrete nodule. The urinary bladder was not imaged. Stomach/Bowel: There is interposition the left lobe of the liver as well as the transverse colon between the anterior wall the stomach and ventral wall of the upper abdomen. The stomach is decompressed with an enteric tube which terminates within the duodenal bulb. No evidence of enteric obstruction. No pneumoperitoneum, pneumatosis or portal venous gas. Vascular/Lymphatic: Scattered atherosclerotic plaque within normal caliber abdominal aorta No bulky retroperitoneal or mesenteric adenopathy on this noncontrast examination. Other: Diffuse body wall anasarca, most conspicuous about the midline of the low back. Musculoskeletal: No acute or aggressive osseous abnormalities. Bilateral facet degenerative change the lower lumbar spine. IMPRESSION: 1. Suboptimal gastric anatomy for percutaneous gastrostomy tube placement given interposition of the left lobe of the liver and transverse colon, potentially improved with gastric insufflation. If attempt at percutaneous gastrostomy tube is still desired, would recommend administration of barium the evening before planned procedure. 2. Unchanged loculated left basilar hydropneumothorax . 3. Extensive bilateral ground-glass and consolidative opacities with associated air bronchograms, nonspecific though worrisome for ARDS and/or multifocal infection including atypical etiologies. 4. Coronary calcifications. 5. Nodularity of the hepatic contour as could be seen in the setting of cirrhosis. Correlation with LFTs could be performed as indicated. 6. Aortic Atherosclerosis (ICD10-I70.0) and Emphysema  (ICD10-J43.9). Electronically Signed   By: Simonne Come M.D.   On: 11/13/2020 08:26   DG CHEST PORT 1 VIEW  Result Date: 11/12/2020 CLINICAL DATA:  Pneumonia and low-grade fever EXAM: PORTABLE CHEST 1 VIEW COMPARISON:  11/08/2020 FINDINGS: An enteric tube remains present. Low lung volumes. Diffuse interstitial process to the lungs may represent coarse fibrosis. No pleural effusions. No pneumothorax. 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 Mediastinal contours appear intact. IMPRESSION: Low lung volumes with diffuse interstitial process to the lungs, likely coarse fibrosis. Electronically Signed   By: Burman Nieves M.D.   On: 11/12/2020 06:43    Assessment/Plan Active Problems:   Acute on chronic respiratory failure with hypoxia (HCC)   Paroxysmal atrial fibrillation (HCC)   Cavitary pneumonia   Tracheostomy status (HCC)   1. Acute on chronic respiratory failure with hypoxia we will continue with full support for now patient attempted on the wean did not tolerate. 2. Paroxysmal atrial fibrillation rate is controlled 3. Cavitary pneumonia but no change 4. Tracheostomy will remain in place   I have personally seen and evaluated the patient, evaluated laboratory and imaging results, formulated the assessment and plan and placed orders. The Patient requires high complexity decision making with multiple systems involvement.  Rounds were done with the Respiratory Therapy Director and  Staff therapists and discussed with nursing staff also.  Allyne Gee, MD [facility] Pulmonary Critical Care Medicine Sleep Medicine

## 2020-11-13 NOTE — Consult Note (Signed)
Chief Complaint: Dysphagia. Request is for gastrostomy tube placement.  Referring Physician(s): Dr. Ardeth Sportsman  Supervising Physician: Richarda Overlie  Patient Status: [facility]  History of Present Illness: Michael Massey is a 69 y.o. male History of a fib with rvr  acute on chronic respiratory failure now with dysphagia.  Patient is trached and on a ventilator  Team is requesting a gastrostomy tube placement. for feeding purposes.    Past Medical History:  Diagnosis Date  . Acute on chronic respiratory failure with hypoxia (HCC)   . Cavitary pneumonia   . Paroxysmal atrial fibrillation (HCC)   . Tracheostomy status (HCC)       Allergies: Patient has no allergy information on record.  Medications: Prior to Admission medications   Not on File     No family history on file.  Social History   Socioeconomic History  . Marital status: Divorced    Spouse name: Not on file  . Number of children: Not on file  . Years of education: Not on file  . Highest education level: Not on file  Occupational History  . Not on file  Tobacco Use  . Smoking status: Not on file  Substance and Sexual Activity  . Alcohol use: Not on file  . Drug use: Not on file  . Sexual activity: Not on file  Other Topics Concern  . Not on file  Social History Narrative  . Not on file   Social Determinants of Health   Financial Resource Strain:   . Difficulty of Paying Living Expenses: Not on file  Food Insecurity:   . Worried About Programme researcher, broadcasting/film/video in the Last Year: Not on file  . Ran Out of Food in the Last Year: Not on file  Transportation Needs:   . Lack of Transportation (Medical): Not on file  . Lack of Transportation (Non-Medical): Not on file  Physical Activity:   . Days of Exercise per Week: Not on file  . Minutes of Exercise per Session: Not on file  Stress:   . Feeling of Stress : Not on file  Social Connections:   . Frequency of Communication with Friends and Family:  Not on file  . Frequency of Social Gatherings with Friends and Family: Not on file  . Attends Religious Services: Not on file  . Active Member of Clubs or Organizations: Not on file  . Attends Banker Meetings: Not on file  . Marital Status: Not on file     Review of Systems: A 12 point ROS discussed and pertinent positives are indicated in the HPI above.  All other systems are negative.  Review of Systems  Unable to perform ROS: Acuity of condition (trached and vented)    Vital Signs: There were no vitals taken for this visit.  Physical Exam Cardiovascular:     Rate and Rhythm: Normal rate and regular rhythm.  Pulmonary:     Breath sounds: Normal breath sounds.     Comments: Vent and trached.  Skin:    General: Skin is warm and dry.  Neurological:     Mental Status: He is alert. Mental status is at baseline.     Imaging: DG Abd 1 View  Result Date: 11/20/2020 CLINICAL DATA:  69 year old male respiratory failure, enteric tube placement. EXAM: ABDOMEN - 1 VIEW COMPARISON:  Portable chest tonight. FINDINGS: Portable AP supine view at 2222 hours. Enteric feeding tube courses from the left upper quadrant across midline and terminates  at the level of the mid duodenum. Non obstructed bowel gas pattern. Coarse, reticular pulmonary opacity at both visible lung bases. No acute osseous abnormality identified. IMPRESSION: Enteric feeding tube terminates in the mid duodenum. Electronically Signed   By: Odessa FlemingH  Hall M.D.   On: 04/06/20 22:37   CT CHEST W CONTRAST  Result Date: 11/03/2020 CLINICAL DATA:  69 year old male with cancer of unknown origin. Staging. EXAM: CT CHEST WITH CONTRAST TECHNIQUE: Multidetector CT imaging of the chest was performed during intravenous contrast administration. CONTRAST:  75mL OMNIPAQUE IOHEXOL 300 MG/ML  SOLN COMPARISON:  Chest radiograph dated 04/06/20. FINDINGS: Cardiovascular: There is no cardiomegaly or pericardial effusion. There is  coronary vascular calcification. The thoracic aorta is unremarkable. The origins of the great vessels of the aortic arch appear patent. Evaluation of the pulmonary arteries is limited due to respiratory motion artifact and suboptimal opacification and timing of the contrast. The central pulmonary arteries are grossly unremarkable. Mediastinum/Nodes: Mediastinal adenopathy measure 18 mm short axis along the right trachea, 13 mm posterior to the trachea, 14 mm in the subcarinal space. Evaluation of the hilar lymph nodes is somewhat limited due to consolidative changes of the lungs. A feeding tube is noted within the esophagus. No mediastinal fluid collection. Lungs/Pleura: There are small bilateral pleural effusions. There is background of emphysema with subpleural blebs. There is diffuse ground-glass opacity throughout the lungs. There are bilateral bronchiectasis. There are small pleural effusions in the fissures bilaterally. There is a 13 mm nodule in the left lower lobe along the fissure. Scattered nodular densities noted in the right middle lobe. There is no pneumothorax. The central airways are patent. Tracheostomy with tip 5.5 cm above the carina. Upper Abdomen: Small bilateral adrenal nodules, indeterminate. Partially visualized left renal upper pole cyst. Musculoskeletal: Degenerative changes of the spine. No acute osseous pathology. IMPRESSION: 1. Diffuse ground-glass opacities throughout the lungs may represent edema, ARDS, or pneumonia. There is probable background of interstitial lung disease with bronchiectasis as well as emphysema and bullous changes of the lungs. 2. A 13 mm pulmonary nodule in the left lower lobe along the fissure. Additional smaller nodules in the right middle and right upper lobes. Clinical correlation and follow-up with CT in 3 months recommended. 3. Mediastinal adenopathy. 4. Small bilateral adrenal nodules, indeterminate. 5. Aortic Atherosclerosis (ICD10-I70.0) and Emphysema  (ICD10-J43.9). Electronically Signed   By: Elgie CollardArash  Radparvar M.D.   On: 11/03/2020 15:55   DG CHEST PORT 1 VIEW  Result Date: 11/12/2020 CLINICAL DATA:  Pneumonia and low-grade fever EXAM: PORTABLE CHEST 1 VIEW COMPARISON:  04/06/20 FINDINGS: An enteric tube remains present. Low lung volumes. Diffuse interstitial process to the lungs may represent coarse fibrosis. No pleural effusions. No pneumothorax. 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 Mediastinal contours appear intact. IMPRESSION: Low lung volumes with diffuse interstitial process to the lungs, likely coarse fibrosis. Electronically Signed   By: Burman NievesWilliam  Stevens M.D.   On: 11/12/2020 06:43   DG Chest Port 1 View  Result Date: 04/06/20 CLINICAL DATA:  69 year old male respiratory failure, enteric tube placement. EXAM: PORTABLE CHEST 1 VIEW COMPARISON:  None. FINDINGS: Portable AP semi upright view at 2217 hours. Diffuse coarse pulmonary interstitial opacity. Medial left lower lung 10 cm cavitary lesion or emphysematous bulla. No pneumothorax. No definite pleural effusion. No cardiomegaly. Nonspecific superior mediastinal and right paratracheal increased mediastinal density. Enteric feeding tube courses through the chest into the abdomen. No acute osseous abnormality identified. IMPRESSION: 1. Enteric feeding tube courses through the chest into the abdomen. 2.  Moderate to severe diffuse coarse pulmonary interstitial opacity which could be chronic lung disease and/or viral/atypical pneumonia. Pulmonary edema felt unlikely. 10 cm cavitary lesion or emphysematous bulla superimposed at the medial lung base. 3. Nonspecific widening of the right paratracheal, mediastinal contour. Query mediastinal mass or lymphadenopathy. Electronically Signed   By: Odessa Fleming M.D.   On: 2020/11/12 22:36   DG Abd Portable 1V  Result Date: 11/10/2020 CLINICAL DATA:  NG tube placement EXAM: PORTABLE ABDOMEN - 1 VIEW COMPARISON:  11-12-2020 FINDINGS: Esophageal tube  tip overlies the distal stomach. Airspace disease at the bases. Nonobstructed upper gas pattern. IMPRESSION: Esophageal tube tip overlies the distal stomach. Electronically Signed   By: Jasmine Pang M.D.   On: 11/10/2020 17:07   ECHOCARDIOGRAM COMPLETE  Result Date: 11/07/2020    ECHOCARDIOGRAM REPORT   Patient Name:   Michael Massey Date of Exam: 11/07/2020 Medical Rec #:  [id number]   Height: Accession #:    [id number]  Weight: Date of Birth:  [birth year]   BSA: Patient Age:    69 years    BP:           101/60 mmHg Patient Gender: M           HR:           119 bpm. Exam Location:  Inpatient Procedure: 2D Echo, Cardiac Doppler and Color Doppler Indications:    Atrial fibrillation  History:        Patient has no prior history of Echocardiogram examinations.                 Risk Factors:Hypertension. GERD. Pleural effusions.  Sonographer:    Ross Ludwig RDCS (AE) Referring Phys: [id number]  Sonographer Comments: Echo performed with patient supine and on artificial respirator. IMPRESSIONS  1. Left ventricular ejection fraction, by estimation, is 50 to 55%. The left ventricle has low normal function. The left ventricle has no regional wall motion abnormalities. There is mild concentric left ventricular hypertrophy. Left ventricular diastolic parameters are indeterminate.  2. Right ventricular systolic function is mildly reduced. The right ventricular size is mildly enlarged.  3. Left atrial size was mildly dilated.  4. Right atrial size was mildly dilated.  5. The mitral valve is degenerative. Mild mitral valve regurgitation. There is mild prolapse of of the mitral valve.  6. The aortic valve is tricuspid. There is mild calcification of the aortic valve. There is mild thickening of the aortic valve. Aortic valve regurgitation is not visualized. Mild aortic valve sclerosis is present, with no evidence of aortic valve stenosis.  7. The inferior vena cava is dilated in size with <50% respiratory variability,  suggesting right atrial pressure of 15 mmHg. FINDINGS  Left Ventricle: Left ventricular ejection fraction, by estimation, is 50 to 55%. The left ventricle has low normal function. The left ventricle has no regional wall motion abnormalities. The left ventricular internal cavity size was normal in size. There is mild concentric left ventricular hypertrophy. Left ventricular diastolic parameters are indeterminate. Right Ventricle: Prominent. The right ventricular size is mildly enlarged. No increase in right ventricular wall thickness. Right ventricular systolic function is mildly reduced. Left Atrium: Left atrial size was mildly dilated. Right Atrium: Right atrial size was mildly dilated. Pericardium: There is no evidence of pericardial effusion. Mitral Valve: The mitral valve is degenerative in appearance. There is mild prolapse of of the mitral valve. Mild mitral annular calcification. Mild mitral valve regurgitation. Tricuspid Valve: The tricuspid valve is normal in  structure. Tricuspid valve regurgitation is mild. Aortic Valve: The aortic valve is tricuspid. There is mild calcification of the aortic valve. There is mild thickening of the aortic valve. Aortic valve regurgitation is not visualized. Mild aortic valve sclerosis is present, with no evidence of aortic valve stenosis. Aortic valve mean gradient measures 3.0 mmHg. Aortic valve peak gradient measures 6.2 mmHg. Aortic valve area, by VTI measures 3.07 cm. Pulmonic Valve: The pulmonic valve was normal in structure. Pulmonic valve regurgitation is not visualized. Aorta: The aortic root is normal in size and structure. There is minimal (Grade I) plaque. Venous: The inferior vena cava is dilated in size with less than 50% respiratory variability, suggesting right atrial pressure of 15 mmHg. IAS/Shunts: The interatrial septum was not assessed.  LEFT VENTRICLE PLAX 2D LVIDd:         4.50 cm LVIDs:         3.60 cm LV PW:         1.40 cm LV IVS:        1.70 cm  LVOT diam:     2.00 cm LV SV:         53 LVOT Area:     3.14 cm  RIGHT VENTRICLE             IVC RV Basal diam:  3.30 cm     IVC diam: 3.20 cm RV S prime:     11.10 cm/s TAPSE (M-mode): 2.1 cm LEFT ATRIUM             RIGHT ATRIUM LA diam:        3.10 cm RA Area:     18.50 cm LA Vol (A2C):   40.2 ml RA Volume:   47.60 ml LA Vol (A4C):   41.4 ml LA Biplane Vol: 43.5 ml  AORTIC VALVE AV Area (Vmax):    2.94 cm AV Area (Vmean):   2.80 cm AV Area (VTI):     3.07 cm AV Vmax:           124.33 cm/s AV Vmean:          81.867 cm/s AV VTI:            0.171 m AV Peak Grad:      6.2 mmHg AV Mean Grad:      3.0 mmHg LVOT Vmax:         116.33 cm/s LVOT Vmean:        72.933 cm/s LVOT VTI:          0.167 m LVOT/AV VTI ratio: 0.98  AORTA Ao Root diam: 3.80 cm  SHUNTS Systemic VTI:  0.17 m Systemic Diam: 2.00 cm Orpah Cobb MD Electronically signed by Orpah Cobb MD Signature Date/Time: 11/07/2020/11:05:21 PM    Final     Labs:  CBC: Recent Labs    11/08/20 0420 11/10/20 0459 11/12/20 0627 11/13/20 0410  WBC 13.8* 12.4* 14.7* 13.5*  HGB 7.4* 7.3* 8.1* 7.8*  HCT 26.0* 25.1* 28.3* 26.7*  PLT 209 253 341 362    COAGS: Recent Labs    11/03/20 1041  INR 1.3*  APTT 38*    BMP: Recent Labs    11/09/20 0342 11/09/20 0342 11/10/20 0459 11/11/20 0557 11/12/20 0627 11/13/20 0410  NA 148*   < > 148* 148* 148* 143  K 3.9  --  3.8  --  4.1 3.8  CL 109  --  108  --  103 98  CO2 33*  --  34*  --  32 35*  GLUCOSE 129*  --  137*  --  115* 113*  BUN 32*  --  35*  --  35* 26*  CALCIUM 9.0  --  9.1  --  9.3 8.9  CREATININE 0.61  --  0.60*  --  0.53* 0.50*  GFRNONAA >60  --  >60  --  >60 >60   < > = values in this interval not displayed.    LIVER FUNCTION TESTS: Recent Labs    11/03/20 1041  BILITOT 0.5  AST 48*  ALT 52*  ALKPHOS 75  PROT 6.7  ALBUMIN 1.6*    Assessment and Plan:  69 y.o. male [facility]. History of a fib with rvr  acute on chronic respiratory failure now with  dysphagia.  Patient is trached and on a ventilator  Team is requesting a gastrostomy tube placement. for feeding purposes.   WBC is 13.5, BUN 26, Cr 0.50. All other labs and medications are within acceptable parameters. Per bedside RN NKDA. COVID - on 11.23.21  IR consulted for possible Gastrostomy tube placement. Case has been reviewed and procedure approved by Dr. Lowella Dandy.  Patient tentatively scheduled for 11.26.21.  Team instructed to: Keep Patient to be NPO after midnight Patient  will need to be given 1 cup barium through his NG tube f the evening of 11.25.21 For anatomy purposes. Bedside RN made aware.   IR will call patient when ready.  Thank you for this interesting consult.  I greatly enjoyed meeting Michael Massey and look forward to participating in their care.  A copy of this report was sent to the requesting provider on this date.  Electronically Signed: Alene Mires, NP 11/13/2020, 8:21 AM   I spent a total of 40 Minutes    in face to face in clinical consultation, greater than 50% of which was counseling/coordinating care for g tube placement

## 2020-11-14 DIAGNOSIS — J9621 Acute and chronic respiratory failure with hypoxia: Secondary | ICD-10-CM | POA: Diagnosis not present

## 2020-11-14 DIAGNOSIS — J189 Pneumonia, unspecified organism: Secondary | ICD-10-CM | POA: Diagnosis not present

## 2020-11-14 DIAGNOSIS — Z930 Tracheostomy status: Secondary | ICD-10-CM | POA: Diagnosis not present

## 2020-11-14 DIAGNOSIS — I480 Paroxysmal atrial fibrillation: Secondary | ICD-10-CM | POA: Diagnosis not present

## 2020-11-14 LAB — COMPREHENSIVE METABOLIC PANEL
ALT: 134 U/L — ABNORMAL HIGH (ref 0–44)
AST: 92 U/L — ABNORMAL HIGH (ref 15–41)
Albumin: 1.8 g/dL — ABNORMAL LOW (ref 3.5–5.0)
Alkaline Phosphatase: 128 U/L — ABNORMAL HIGH (ref 38–126)
Anion gap: 6 (ref 5–15)
BUN: 30 mg/dL — ABNORMAL HIGH (ref 8–23)
CO2: 36 mmol/L — ABNORMAL HIGH (ref 22–32)
Calcium: 9 mg/dL (ref 8.9–10.3)
Chloride: 100 mmol/L (ref 98–111)
Creatinine, Ser: 0.65 mg/dL (ref 0.61–1.24)
GFR, Estimated: >60 mL/min (ref >60)
Glucose, Bld: 129 mg/dL — ABNORMAL HIGH (ref 70–99)
Potassium: 4.3 mmol/L (ref 3.5–5.1)
Sodium: 142 mmol/L (ref 135–145)
Total Bilirubin: 0.6 mg/dL (ref 0.3–1.2)
Total Protein: 6.9 g/dL (ref 6.5–8.1)

## 2020-11-14 LAB — MAGNESIUM: Magnesium: 2.1 mg/dL (ref 1.7–2.4)

## 2020-11-14 LAB — PHOSPHORUS: Phosphorus: 4.5 mg/dL (ref 2.5–4.6)

## 2020-11-14 NOTE — Progress Notes (Signed)
Pulmonary Critical Care Medicine [facility] [place]   PULMONARY CRITICAL CARE SERVICE  PROGRESS NOTE  Date of Service: 11/14/2020  Chukwuebuka Churchill  WFU:[id number]  DOB: 1951-03-12   DOA: 11/14/2020  Referring Physician: Carron Curie, MD  HPI: Taiquan Campanaro is a 69 y.o. male seen for follow up of Acute on Chronic Respiratory Failure.  Patient is on the ventilator and full support on pressure support yesterday did not tolerate  Medications: Reviewed on Rounds  Physical Exam:  Vitals: Temperature is 97.1 pulse 74 respiratory 27 blood pressure is 112/66 saturations 99%  Ventilator Settings on assist control FiO2 is 50% tidal volume 500 with a PEEP of 7  . General: Comfortable at this time . Eyes: Grossly normal lids, irises & conjunctiva . ENT: grossly tongue is normal . Neck: no obvious mass . Cardiovascular: S1 S2 normal no gallop . Respiratory: No rhonchi coarse breath sounds . Abdomen: soft . Skin: no rash seen on limited exam . Musculoskeletal: not rigid . Psychiatric:unable to assess . Neurologic: no seizure no involuntary movements         Lab Data:   Basic Metabolic Panel: Recent Labs  Lab 11/07/20 1556 11/08/20 0420 11/09/20 0342 11/09/20 0342 11/10/20 0459 11/11/20 0557 11/12/20 0627 11/13/20 0410 11/14/20 0434  NA 143   < > 148*   < > 148* 148* 148* 143 142  K 3.7   < > 3.9  --  3.8  --  4.1 3.8 4.3  CL 106   < > 109  --  108  --  103 98 100  CO2 31   < > 33*  --  34*  --  32 35* 36*  GLUCOSE 118*   < > 129*  --  137*  --  115* 113* 129*  BUN 24*   < > 32*  --  35*  --  35* 26* 30*  CREATININE 0.65   < > 0.61  --  0.60*  --  0.53* 0.50* 0.65  CALCIUM 8.8*   < > 9.0  --  9.1  --  9.3 8.9 9.0  MG 2.0  --   --   --   --   --   --  2.0 2.1  PHOS  --   --   --   --   --   --   --   --  4.5   < > = values in this interval not displayed.    ABG: No results for input(s): PHART, PCO2ART, PO2ART, HCO3, O2SAT in the last 168 hours.  Liver Function  Tests: Recent Labs  Lab 11/14/20 0434  AST 92*  ALT 134*  ALKPHOS 128*  BILITOT 0.6  PROT 6.9  ALBUMIN 1.8*   No results for input(s): LIPASE, AMYLASE in the last 168 hours. No results for input(s): AMMONIA in the last 168 hours.  CBC: Recent Labs  Lab 11/08/20 0420 11/10/20 0459 11/12/20 0627 11/13/20 0410  WBC 13.8* 12.4* 14.7* 13.5*  HGB 7.4* 7.3* 8.1* 7.8*  HCT 26.0* 25.1* 28.3* 26.7*  MCV 100.0 100.8* 98.6 96.7  PLT 209 253 341 362    Cardiac Enzymes: No results for input(s): CKTOTAL, CKMB, CKMBINDEX, TROPONINI in the last 168 hours.  BNP (last 3 results) No results for input(s): BNP in the last 8760 hours.  ProBNP (last 3 results) No results for input(s): PROBNP in the last 8760 hours.  Radiological Exams: CT ABDOMEN WO CONTRAST  Result Date: 11/13/2020 CLINICAL DATA:  Evaluate anatomy  prior to potential percutaneous gastrostomy tube placement. EXAM: CT ABDOMEN WITHOUT CONTRAST TECHNIQUE: Multidetector CT imaging of the abdomen was performed following the standard protocol without IV contrast. COMPARISON:  Chest CT-11/03/2020 FINDINGS: Lower chest: Limited visualization of the lower thorax demonstrates loculated hydropneumothorax involving posteromedial aspect of the left hemithorax. Advanced paraseptal emphysematous change and extensive bilateral ground-glass and consolidative opacities with associated air bronchograms. Normal heart size. Coronary artery calcifications. There is diffuse decreased attenuation of the intra cardiac blood pool suggestive of anemia. Hepatobiliary: Mild nodularity of the hepatic contour. Normal noncontrast appearance of the gallbladder given degree of distention. No radiopaque gallstones. No ascites. Pancreas: Normal noncontrast appearance of the pancreas. Spleen: Normal noncontrast appearance of the spleen. Adrenals/Urinary Tract: Punctate (1-2 mm) nonobstructing renal stones are seen bilaterally (right-image 50, series 3; left-image 48,  series 3). There is a minimal amount of symmetric likely age and body habitus related perinephric stranding. No urinary obstruction. Note made of 2 partially exophytic left-sided renal lesions, largest of which measures 2 cm in diameter (image 40, series 3), incompletely characterized without intravenous contrast though potentially representative of renal cysts. Mild thickening the bilateral adrenal glands without discrete nodule. The urinary bladder was not imaged. Stomach/Bowel: There is interposition the left lobe of the liver as well as the transverse colon between the anterior wall the stomach and ventral wall of the upper abdomen. The stomach is decompressed with an enteric tube which terminates within the duodenal bulb. No evidence of enteric obstruction. No pneumoperitoneum, pneumatosis or portal venous gas. Vascular/Lymphatic: Scattered atherosclerotic plaque within normal caliber abdominal aorta No bulky retroperitoneal or mesenteric adenopathy on this noncontrast examination. Other: Diffuse body wall anasarca, most conspicuous about the midline of the low back. Musculoskeletal: No acute or aggressive osseous abnormalities. Bilateral facet degenerative change the lower lumbar spine. IMPRESSION: 1. Suboptimal gastric anatomy for percutaneous gastrostomy tube placement given interposition of the left lobe of the liver and transverse colon, potentially improved with gastric insufflation. If attempt at percutaneous gastrostomy tube is still desired, would recommend administration of barium the evening before planned procedure. 2. Unchanged loculated left basilar hydropneumothorax . 3. Extensive bilateral ground-glass and consolidative opacities with associated air bronchograms, nonspecific though worrisome for ARDS and/or multifocal infection including atypical etiologies. 4. Coronary calcifications. 5. Nodularity of the hepatic contour as could be seen in the setting of cirrhosis. Correlation with LFTs could  be performed as indicated. 6. Aortic Atherosclerosis (ICD10-I70.0) and Emphysema (ICD10-J43.9). Electronically Signed   By: Simonne Come M.D.   On: 11/13/2020 08:26   US Abdomen Limited RUQ (LIVER/GB)  Result Date: 11/13/2020 CLINICAL DATA:  Nodular hepatic contours, history of elevated liver function EXAM: ULTRASOUND ABDOMEN LIMITED RIGHT UPPER QUADRANT COMPARISON:  CT of the abdomen dated 11/13/2020 FINDINGS: Gallbladder: No gallstones or wall thickening visualized. No sonographic Murphy sign noted by sonographer. Common bile duct: Diameter: 4 mm Liver: Coarse hepatic echotexture with nodular hepatic contours. Morphologic features of cirrhosis. No focal lesion on limited evaluation. Portal vein is patent on color Doppler imaging with normal direction of blood flow towards the liver. Other: Trace RIGHT pleural effusion. IMPRESSION: 1. Normal gallbladder without biliary duct dilation or tenderness over the gallbladder. 2. Cirrhotic morphologic characteristics with respect to liver with coarsened echotexture and nodular hepatic contour. Correlate with any clinical or laboratory evidence of liver disease or with risk factors. 3. Trace RIGHT pleural effusion. Electronically Signed   By: Donzetta Kohut M.D.   On: 11/13/2020 19:53    Assessment/Plan Active Problems:  Acute on chronic respiratory failure with hypoxia (HCC)   Paroxysmal atrial fibrillation (HCC)   Cavitary pneumonia   Tracheostomy status (HCC)   1. Acute on chronic respiratory failure with hypoxia we will continue with assist control patient on 50% FiO2 2. Paroxysmal atrial fibrillation rate is controlled 3. Cavitary pneumonia no change 4. Tracheostomy remains in place   I have personally seen and evaluated the patient, evaluated laboratory and imaging results, formulated the assessment and plan and placed orders. The Patient requires high complexity decision making with multiple systems involvement.  Rounds were done with the  Respiratory Therapy Director and Staff therapists and discussed with nursing staff also.  Yevonne Pax, MD [facility] Pulmonary Critical Care Medicine Sleep Medicine

## 2020-11-15 DIAGNOSIS — J189 Pneumonia, unspecified organism: Secondary | ICD-10-CM | POA: Diagnosis not present

## 2020-11-15 DIAGNOSIS — J9621 Acute and chronic respiratory failure with hypoxia: Secondary | ICD-10-CM | POA: Diagnosis not present

## 2020-11-15 DIAGNOSIS — Z930 Tracheostomy status: Secondary | ICD-10-CM | POA: Diagnosis not present

## 2020-11-15 DIAGNOSIS — I480 Paroxysmal atrial fibrillation: Secondary | ICD-10-CM | POA: Diagnosis not present

## 2020-11-15 LAB — BASIC METABOLIC PANEL
Anion gap: 7 (ref 5–15)
BUN: 31 mg/dL — ABNORMAL HIGH (ref 8–23)
CO2: 36 mmol/L — ABNORMAL HIGH (ref 22–32)
Calcium: 8.5 mg/dL — ABNORMAL LOW (ref 8.9–10.3)
Chloride: 97 mmol/L — ABNORMAL LOW (ref 98–111)
Creatinine, Ser: 0.54 mg/dL — ABNORMAL LOW (ref 0.61–1.24)
GFR, Estimated: >60 mL/min (ref >60)
Glucose, Bld: 146 mg/dL — ABNORMAL HIGH (ref 70–99)
Potassium: 4.4 mmol/L (ref 3.5–5.1)
Sodium: 140 mmol/L (ref 135–145)

## 2020-11-15 LAB — CBC
HCT: 27.2 % — ABNORMAL LOW (ref 39.0–52.0)
Hemoglobin: 7.7 g/dL — ABNORMAL LOW (ref 13.0–17.0)
MCH: 28.1 pg (ref 26.0–34.0)
MCHC: 28.3 g/dL — ABNORMAL LOW (ref 30.0–36.0)
MCV: 99.3 fL (ref 80.0–100.0)
Platelets: 418 10*3/uL — ABNORMAL HIGH (ref 150–400)
RBC: 2.74 MIL/uL — ABNORMAL LOW (ref 4.22–5.81)
RDW: 20.1 % — ABNORMAL HIGH (ref 11.5–15.5)
WBC: 12 10*3/uL — ABNORMAL HIGH (ref 4.0–10.5)
nRBC: 0.2 % (ref 0.0–0.2)

## 2020-11-15 LAB — PHOSPHORUS: Phosphorus: 3 mg/dL (ref 2.5–4.6)

## 2020-11-15 LAB — MAGNESIUM: Magnesium: 2 mg/dL (ref 1.7–2.4)

## 2020-11-15 NOTE — Progress Notes (Signed)
Pulmonary Critical Care Medicine [facility] [place]   PULMONARY CRITICAL CARE SERVICE  PROGRESS NOTE  Date of Service: 11/15/2020  Michael Massey  ZHY:[id number]  DOB: [birth year]   DOA: 10/29/2020  Referring Physician: Carron Curie, MD  HPI: Michael Massey is a 69 y.o. male seen for follow up of Acute on Chronic Respiratory Failure.  Currently is on full support on assist control mode not tolerating weaning  Medications: Reviewed on Rounds  Physical Exam:  Vitals: Temperature is 96.9 pulse 98 respiratory rate 30 blood pressure is 131/56 saturations 97%  Ventilator Settings on assist control FiO2 is 45% PEEP 7  . General: Comfortable at this time . Eyes: Grossly normal lids, irises & conjunctiva . ENT: grossly tongue is normal . Neck: no obvious mass . Cardiovascular: S1 S2 normal no gallop . Respiratory: No rhonchi no rales are noted at this time . Abdomen: soft . Skin: no rash seen on limited exam . Musculoskeletal: not rigid . Psychiatric:unable to assess . Neurologic: no seizure no involuntary movements         Lab Data:   Basic Metabolic Panel: Recent Labs  Lab 11/10/20 0459 11/10/20 0459 11/11/20 0557 11/12/20 0627 11/13/20 0410 11/14/20 0434 11/15/20 0332  NA 148*   < > 148* 148* 143 142 140  K 3.8  --   --  4.1 3.8 4.3 4.4  CL 108  --   --  103 98 100 97*  CO2 34*  --   --  32 35* 36* 36*  GLUCOSE 137*  --   --  115* 113* 129* 146*  BUN 35*  --   --  35* 26* 30* 31*  CREATININE 0.60*  --   --  0.53* 0.50* 0.65 0.54*  CALCIUM 9.1  --   --  9.3 8.9 9.0 8.5*  MG  --   --   --   --  2.0 2.1 2.0  PHOS  --   --   --   --   --  4.5 3.0   < > = values in this interval not displayed.    ABG: No results for input(s): PHART, PCO2ART, PO2ART, HCO3, O2SAT in the last 168 hours.  Liver Function Tests: Recent Labs  Lab 11/14/20 0434  AST 92*  ALT 134*  ALKPHOS 128*  BILITOT 0.6  PROT 6.9  ALBUMIN 1.8*   No results for input(s): LIPASE, AMYLASE  in the last 168 hours. No results for input(s): AMMONIA in the last 168 hours.  CBC: Recent Labs  Lab 11/10/20 0459 11/12/20 0627 11/13/20 0410 11/15/20 0332  WBC 12.4* 14.7* 13.5* 12.0*  HGB 7.3* 8.1* 7.8* 7.7*  HCT 25.1* 28.3* 26.7* 27.2*  MCV 100.8* 98.6 96.7 99.3  PLT 253 341 362 418*    Cardiac Enzymes: No results for input(s): CKTOTAL, CKMB, CKMBINDEX, TROPONINI in the last 168 hours.  BNP (last 3 results) No results for input(s): BNP in the last 8760 hours.  ProBNP (last 3 results) No results for input(s): PROBNP in the last 8760 hours.  Radiological Exams: US Abdomen Limited RUQ (LIVER/GB)  Result Date: 11/13/2020 CLINICAL DATA:  Nodular hepatic contours, history of elevated liver function EXAM: ULTRASOUND ABDOMEN LIMITED RIGHT UPPER QUADRANT COMPARISON:  CT of the abdomen dated 11/13/2020 FINDINGS: Gallbladder: No gallstones or wall thickening visualized. No sonographic Murphy sign noted by sonographer. Common bile duct: Diameter: 4 mm Liver: Coarse hepatic echotexture with nodular hepatic contours. Morphologic features of cirrhosis. No focal lesion on limited evaluation. Portal  vein is patent on color Doppler imaging with normal direction of blood flow towards the liver. Other: Trace RIGHT pleural effusion. IMPRESSION: 1. Normal gallbladder without biliary duct dilation or tenderness over the gallbladder. 2. Cirrhotic morphologic characteristics with respect to liver with coarsened echotexture and nodular hepatic contour. Correlate with any clinical or laboratory evidence of liver disease or with risk factors. 3. Trace RIGHT pleural effusion. Electronically Signed   By: Donzetta Kohut M.D.   On: 11/13/2020 19:53    Assessment/Plan Active Problems:   Acute on chronic respiratory failure with hypoxia (HCC)   Paroxysmal atrial fibrillation (HCC)   Cavitary pneumonia   Tracheostomy status (HCC)   1. Acute on chronic respiratory failure hypoxia we will continue with  assist control now 45% FiO2 mechanics have been poor not able to wean 2. Paroxysmal atrial fibrillation rate is controlled 3. Pneumonia treated we will monitor radiologically. 4. Tracheostomy remains in place   I have personally seen and evaluated the patient, evaluated laboratory and imaging results, formulated the assessment and plan and placed orders. The Patient requires high complexity decision making with multiple systems involvement.  Rounds were done with the Respiratory Therapy Director and Staff therapists and discussed with nursing staff also.  Yevonne Pax, MD [facility] Pulmonary Critical Care Medicine Sleep Medicine

## 2020-11-16 DIAGNOSIS — I480 Paroxysmal atrial fibrillation: Secondary | ICD-10-CM | POA: Diagnosis not present

## 2020-11-16 DIAGNOSIS — J9621 Acute and chronic respiratory failure with hypoxia: Secondary | ICD-10-CM | POA: Diagnosis not present

## 2020-11-16 DIAGNOSIS — J189 Pneumonia, unspecified organism: Secondary | ICD-10-CM | POA: Diagnosis not present

## 2020-11-16 DIAGNOSIS — Z930 Tracheostomy status: Secondary | ICD-10-CM | POA: Diagnosis not present

## 2020-11-16 LAB — PHOSPHORUS: Phosphorus: 3 mg/dL (ref 2.5–4.6)

## 2020-11-16 LAB — CBC
HCT: 27.1 % — ABNORMAL LOW (ref 39.0–52.0)
Hemoglobin: 7.8 g/dL — ABNORMAL LOW (ref 13.0–17.0)
MCH: 28.3 pg (ref 26.0–34.0)
MCHC: 28.8 g/dL — ABNORMAL LOW (ref 30.0–36.0)
MCV: 98.2 fL (ref 80.0–100.0)
Platelets: 442 10*3/uL — ABNORMAL HIGH (ref 150–400)
RBC: 2.76 MIL/uL — ABNORMAL LOW (ref 4.22–5.81)
RDW: 20 % — ABNORMAL HIGH (ref 11.5–15.5)
WBC: 11.5 10*3/uL — ABNORMAL HIGH (ref 4.0–10.5)
nRBC: 0 % (ref 0.0–0.2)

## 2020-11-16 LAB — BASIC METABOLIC PANEL
Anion gap: 10 (ref 5–15)
BUN: 32 mg/dL — ABNORMAL HIGH (ref 8–23)
CO2: 35 mmol/L — ABNORMAL HIGH (ref 22–32)
Calcium: 8.9 mg/dL (ref 8.9–10.3)
Chloride: 97 mmol/L — ABNORMAL LOW (ref 98–111)
Creatinine, Ser: 0.53 mg/dL — ABNORMAL LOW (ref 0.61–1.24)
GFR, Estimated: >60 mL/min (ref >60)
Glucose, Bld: 90 mg/dL (ref 70–99)
Potassium: 4.6 mmol/L (ref 3.5–5.1)
Sodium: 142 mmol/L (ref 135–145)

## 2020-11-16 LAB — MAGNESIUM: Magnesium: 2.2 mg/dL (ref 1.7–2.4)

## 2020-11-16 NOTE — Progress Notes (Signed)
Pulmonary Critical Care Medicine [facility] [place]   PULMONARY CRITICAL CARE SERVICE  PROGRESS NOTE  Date of Service: 11/16/2020  Michael Massey  [id number]  DOB: [birth year]   DOA: 11/09/2020  Referring Physician: Carron Curie, MD  HPI: Michael Massey is a 69 y.o. male seen for follow up of Acute on Chronic Respiratory Failure.  Patient currently is on assist control has been on 40% FiO2 good saturations are noted  Medications: Reviewed on Rounds  Physical Exam:  Vitals: Temperature is 97.0 pulse 75 respiratory 23 blood pressure is 108/55 saturations 97%  Ventilator Settings patient currently is on assist control FiO2 40% tidal volume 530 PEEP 7  . General: Comfortable at this time . Eyes: Grossly normal lids, irises & conjunctiva . ENT: grossly tongue is normal . Neck: no obvious mass . Cardiovascular: S1 S2 normal no gallop . Respiratory: No rhonchi very coarse breath sounds . Abdomen: soft . Skin: no rash seen on limited exam . Musculoskeletal: not rigid . Psychiatric:unable to assess . Neurologic: no seizure no involuntary movements         Lab Data:   Basic Metabolic Panel: Recent Labs  Lab 11/12/20 0627 11/13/20 0410 11/14/20 0434 11/15/20 0332 11/16/20 0436  NA 148* 143 142 140 142  K 4.1 3.8 4.3 4.4 4.6  CL 103 98 100 97* 97*  CO2 32 35* 36* 36* 35*  GLUCOSE 115* 113* 129* 146* 90  BUN 35* 26* 30* 31* 32*  CREATININE 0.53* 0.50* 0.65 0.54* 0.53*  CALCIUM 9.3 8.9 9.0 8.5* 8.9  MG  --  2.0 2.1 2.0 2.2  PHOS  --   --  4.5 3.0 3.0    ABG: No results for input(s): PHART, PCO2ART, PO2ART, HCO3, O2SAT in the last 168 hours.  Liver Function Tests: Recent Labs  Lab 11/14/20 0434  AST 92*  ALT 134*  ALKPHOS 128*  BILITOT 0.6  PROT 6.9  ALBUMIN 1.8*   No results for input(s): LIPASE, AMYLASE in the last 168 hours. No results for input(s): AMMONIA in the last 168 hours.  CBC: Recent Labs  Lab 11/10/20 0459 11/12/20 0627  11/13/20 0410 11/15/20 0332 11/16/20 0436  WBC 12.4* 14.7* 13.5* 12.0* 11.5*  HGB 7.3* 8.1* 7.8* 7.7* 7.8*  HCT 25.1* 28.3* 26.7* 27.2* 27.1*  MCV 100.8* 98.6 96.7 99.3 98.2  PLT 253 341 362 418* 442*    Cardiac Enzymes: No results for input(s): CKTOTAL, CKMB, CKMBINDEX, TROPONINI in the last 168 hours.  BNP (last 3 results) No results for input(s): BNP in the last 8760 hours.  ProBNP (last 3 results) No results for input(s): PROBNP in the last 8760 hours.  Radiological Exams: No results found.  Assessment/Plan Active Problems:   Acute on chronic respiratory failure with hypoxia (HCC)   Paroxysmal atrial fibrillation (HCC)   Cavitary pneumonia   Tracheostomy status (HCC)   1. Acute on chronic respiratory failure hypoxia we will continue with assist control FiO2 40% PEEP is at 7 2. Paroxysmal atrial fibrillation rate is controlled 3. Cavitary pneumonia no change 4. Tracheostomy remains in place   I have personally seen and evaluated the patient, evaluated laboratory and imaging results, formulated the assessment and plan and placed orders. The Patient requires high complexity decision making with multiple systems involvement.  Rounds were done with the Respiratory Therapy Director and Staff therapists and discussed with nursing staff also.  Yevonne Pax, MD [facility] Pulmonary Critical Care Medicine Sleep Medicine

## 2020-11-16 NOTE — Progress Notes (Signed)
Called to [employer]. Per RN, pt did not receive barium. I informed Dr Elby Showers of this. Pt will have to be rescheduled. RAD PA aware. 5ERN aware

## 2020-11-16 NOTE — Progress Notes (Signed)
Patient originally planned for g tube placement today in IR - was to receive barium via NG evening of 11/15/20 however per floor RN this was not given.  Will plan for g-tube placement on Monday 11/19/20 - patient to receive 1 cup thin barium via NG at 8 pm on 11/18/20. New orders have been placed, please call IR with questions or concerns.  Lynnette Caffey, PA-C

## 2020-11-17 DIAGNOSIS — Z930 Tracheostomy status: Secondary | ICD-10-CM | POA: Diagnosis not present

## 2020-11-17 DIAGNOSIS — J189 Pneumonia, unspecified organism: Secondary | ICD-10-CM | POA: Diagnosis not present

## 2020-11-17 DIAGNOSIS — I480 Paroxysmal atrial fibrillation: Secondary | ICD-10-CM | POA: Diagnosis not present

## 2020-11-17 DIAGNOSIS — J9621 Acute and chronic respiratory failure with hypoxia: Secondary | ICD-10-CM | POA: Diagnosis not present

## 2020-11-17 LAB — BASIC METABOLIC PANEL
Anion gap: 10 (ref 5–15)
BUN: 28 mg/dL — ABNORMAL HIGH (ref 8–23)
CO2: 34 mmol/L — ABNORMAL HIGH (ref 22–32)
Calcium: 8.6 mg/dL — ABNORMAL LOW (ref 8.9–10.3)
Chloride: 97 mmol/L — ABNORMAL LOW (ref 98–111)
Creatinine, Ser: 0.59 mg/dL — ABNORMAL LOW (ref 0.61–1.24)
GFR, Estimated: >60 mL/min (ref >60)
Glucose, Bld: 143 mg/dL — ABNORMAL HIGH (ref 70–99)
Potassium: 4.9 mmol/L (ref 3.5–5.1)
Sodium: 141 mmol/L (ref 135–145)

## 2020-11-17 LAB — MAGNESIUM: Magnesium: 2 mg/dL (ref 1.7–2.4)

## 2020-11-17 NOTE — Progress Notes (Signed)
Pulmonary Critical Care Medicine [facility] [place]   PULMONARY CRITICAL CARE SERVICE  PROGRESS NOTE  Date of Service: 11/17/2020  Michael Massey  [id number]  DOB: 1951/02/25   DOA: 10/29/2020  Referring Physician: Carron Curie, MD  HPI: Michael Massey is a 69 y.o. male seen for follow up of Acute on Chronic Respiratory Failure.  Patient at this time is on full support on assist control mode has been on 45% FiO2.  Tidal volume is 500 PEEP of 7 this morning patient was noted to have increased heart rate and rapid atrial fibrillation  Medications: Reviewed on Rounds  Physical Exam:  Vitals: Temperature is 97.1 pulse 145 respiratory rate 35 blood pressure is 124/70 saturations 99%  Ventilator Settings on assist control FiO2 is 45% tidal volume 500 PEEP 7  . General: Comfortable at this time . Eyes: Grossly normal lids, irises & conjunctiva . ENT: grossly tongue is normal . Neck: no obvious mass . Cardiovascular: S1 S2 normal no gallop . Respiratory: Coarse breath sounds with few rhonchi . Abdomen: soft . Skin: no rash seen on limited exam . Musculoskeletal: not rigid . Psychiatric:unable to assess . Neurologic: no seizure no involuntary movements         Lab Data:   Basic Metabolic Panel: Recent Labs  Lab 11/13/20 0410 11/14/20 0434 11/15/20 0332 11/16/20 0436 11/17/20 0459  NA 143 142 140 142 141  K 3.8 4.3 4.4 4.6 4.9  CL 98 100 97* 97* 97*  CO2 35* 36* 36* 35* 34*  GLUCOSE 113* 129* 146* 90 143*  BUN 26* 30* 31* 32* 28*  CREATININE 0.50* 0.65 0.54* 0.53* 0.59*  CALCIUM 8.9 9.0 8.5* 8.9 8.6*  MG 2.0 2.1 2.0 2.2 2.0  PHOS  --  4.5 3.0 3.0  --     ABG: No results for input(s): PHART, PCO2ART, PO2ART, HCO3, O2SAT in the last 168 hours.  Liver Function Tests: Recent Labs  Lab 11/14/20 0434  AST 92*  ALT 134*  ALKPHOS 128*  BILITOT 0.6  PROT 6.9  ALBUMIN 1.8*   No results for input(s): LIPASE, AMYLASE in the last 168 hours. No results for  input(s): AMMONIA in the last 168 hours.  CBC: Recent Labs  Lab 11/12/20 0627 11/13/20 0410 11/15/20 0332 11/16/20 0436  WBC 14.7* 13.5* 12.0* 11.5*  HGB 8.1* 7.8* 7.7* 7.8*  HCT 28.3* 26.7* 27.2* 27.1*  MCV 98.6 96.7 99.3 98.2  PLT 341 362 418* 442*    Cardiac Enzymes: No results for input(s): CKTOTAL, CKMB, CKMBINDEX, TROPONINI in the last 168 hours.  BNP (last 3 results) No results for input(s): BNP in the last 8760 hours.  ProBNP (last 3 results) No results for input(s): PROBNP in the last 8760 hours.  Radiological Exams: No results found.  Assessment/Plan Active Problems:   Acute on chronic respiratory failure with hypoxia (HCC)   Paroxysmal atrial fibrillation (HCC)   Cavitary pneumonia   Tracheostomy status (HCC)   1. Acute on chronic respiratory failure hypoxia we will continue with full support on the ventilator cardiac issues to be sorted out we will reassess the bleeding 2. Paroxysmal atrial fibrillation rate now is in rapid ventricular response discussed with primary care team 3. Continue to the 4. Tracheostomy will remain placed   I have personally seen and evaluated the patient, evaluated laboratory and imaging results, formulated the assessment and plan and placed orders. The Patient requires high complexity decision making with multiple systems involvement.  Rounds were done with the Respiratory Therapy  Librarian, academic therapists and discussed with nursing staff also.  Allyne Gee, MD [facility] Pulmonary Critical Care Medicine Sleep Medicine

## 2020-11-18 ENCOUNTER — Other Ambulatory Visit (HOSPITAL_COMMUNITY): Payer: Medicare Other

## 2020-11-18 LAB — MAGNESIUM: Magnesium: 1.9 mg/dL (ref 1.7–2.4)

## 2020-11-18 LAB — BLOOD GAS, ARTERIAL
Acid-Base Excess: 10.1 mmol/L — ABNORMAL HIGH (ref 0.0–2.0)
Acid-Base Excess: 13.8 mmol/L — ABNORMAL HIGH (ref 0.0–2.0)
Bicarbonate: 35.8 mmol/L — ABNORMAL HIGH (ref 20.0–28.0)
Bicarbonate: 38.4 mmol/L — ABNORMAL HIGH (ref 20.0–28.0)
FIO2: 50
FIO2: 40
O2 Saturation: 95.4 %
O2 Saturation: 97.9 %
Patient temperature: 37.1
Patient temperature: 37
pCO2 arterial: 66.6 mmHg (ref 32.0–48.0)
pCO2 arterial: 53.1 mmHg — ABNORMAL HIGH (ref 32.0–48.0)
pH, Arterial: 7.351 (ref 7.350–7.450)
pH, Arterial: 7.473 — ABNORMAL HIGH (ref 7.350–7.450)
pO2, Arterial: 85.2 mmHg (ref 83.0–108.0)
pO2, Arterial: 89.7 mmHg (ref 83.0–108.0)

## 2020-11-18 LAB — CBC
HCT: 28.3 % — ABNORMAL LOW (ref 39.0–52.0)
Hemoglobin: 8.3 g/dL — ABNORMAL LOW (ref 13.0–17.0)
MCH: 28.1 pg (ref 26.0–34.0)
MCHC: 29.3 g/dL — ABNORMAL LOW (ref 30.0–36.0)
MCV: 95.9 fL (ref 80.0–100.0)
Platelets: 488 10*3/uL — ABNORMAL HIGH (ref 150–400)
RBC: 2.95 MIL/uL — ABNORMAL LOW (ref 4.22–5.81)
RDW: 19.5 % — ABNORMAL HIGH (ref 11.5–15.5)
WBC: 13.4 10*3/uL — ABNORMAL HIGH (ref 4.0–10.5)
nRBC: 0 % (ref 0.0–0.2)

## 2020-11-18 LAB — RENAL FUNCTION PANEL
Albumin: 2 g/dL — ABNORMAL LOW (ref 3.5–5.0)
Anion gap: 8 (ref 5–15)
BUN: 26 mg/dL — ABNORMAL HIGH (ref 8–23)
CO2: 36 mmol/L — ABNORMAL HIGH (ref 22–32)
Calcium: 8.8 mg/dL — ABNORMAL LOW (ref 8.9–10.3)
Chloride: 95 mmol/L — ABNORMAL LOW (ref 98–111)
Creatinine, Ser: 0.56 mg/dL — ABNORMAL LOW (ref 0.61–1.24)
GFR, Estimated: >60 mL/min (ref >60)
Glucose, Bld: 154 mg/dL — ABNORMAL HIGH (ref 70–99)
Phosphorus: 3.6 mg/dL (ref 2.5–4.6)
Potassium: 4.3 mmol/L (ref 3.5–5.1)
Sodium: 139 mmol/L (ref 135–145)

## 2020-11-18 NOTE — Progress Notes (Signed)
Spoke with RN to ensure orders in place for possible procedure tomorrow.  RN Darius to administer barium this evening.  Abdominal XR ordered for tomorrow AM.   Loyce Dys, MS RD PA-C

## 2020-11-19 ENCOUNTER — Other Ambulatory Visit (HOSPITAL_COMMUNITY): Payer: Medicare Other

## 2020-11-19 DIAGNOSIS — I480 Paroxysmal atrial fibrillation: Secondary | ICD-10-CM | POA: Diagnosis not present

## 2020-11-19 DIAGNOSIS — J189 Pneumonia, unspecified organism: Secondary | ICD-10-CM | POA: Diagnosis not present

## 2020-11-19 DIAGNOSIS — Z930 Tracheostomy status: Secondary | ICD-10-CM | POA: Diagnosis not present

## 2020-11-19 DIAGNOSIS — J9621 Acute and chronic respiratory failure with hypoxia: Secondary | ICD-10-CM | POA: Diagnosis not present

## 2020-11-19 LAB — BLOOD GAS, ARTERIAL
Acid-Base Excess: 10.4 mmol/L — ABNORMAL HIGH (ref 0.0–2.0)
Bicarbonate: 35.4 mmol/L — ABNORMAL HIGH (ref 20.0–28.0)
FIO2: 100
O2 Saturation: 84.8 %
Patient temperature: 36.8
pCO2 arterial: 56.6 mmHg — ABNORMAL HIGH (ref 32.0–48.0)
pH, Arterial: 7.412 (ref 7.350–7.450)
pO2, Arterial: 55.1 mmHg — ABNORMAL LOW (ref 83.0–108.0)

## 2020-11-19 NOTE — Progress Notes (Signed)
Pulmonary Critical Care Medicine [facility] [place]   PULMONARY CRITICAL CARE SERVICE  PROGRESS NOTE  Date of Service: 11/19/2020  Michael Massey  EVO:[id number]  DOB: 1951-02-04   DOA: 10/29/2020  Referring Physician: Carron Curie, MD  HPI: Michael Massey is a 69 y.o. male seen for follow up of Acute on Chronic Respiratory Failure.  Patient at this time is on pressure control mode has been on 45% FiO2 good saturations are noted  Medications: Reviewed on Rounds  Physical Exam:  Vitals: Temperature is 98.9 pulse 111 respiratory rate is 32 blood pressure is 103/65 saturations 95%  Ventilator Settings on pressure assist control FiO2 is 45% IP 25 PEEP 7  . General: Comfortable at this time . Eyes: Grossly normal lids, irises & conjunctiva . ENT: grossly tongue is normal . Neck: no obvious mass . Cardiovascular: S1 S2 normal no gallop . Respiratory: Scattered rhonchi expansion is equal . Abdomen: soft . Skin: no rash seen on limited exam . Musculoskeletal: not rigid . Psychiatric:unable to assess . Neurologic: no seizure no involuntary movements         Lab Data:   Basic Metabolic Panel: Recent Labs  Lab 11/14/20 0434 11/15/20 0332 11/16/20 0436 11/17/20 0459 11/18/20 0310  NA 142 140 142 141 139  K 4.3 4.4 4.6 4.9 4.3  CL 100 97* 97* 97* 95*  CO2 36* 36* 35* 34* 36*  GLUCOSE 129* 146* 90 143* 154*  BUN 30* 31* 32* 28* 26*  CREATININE 0.65 0.54* 0.53* 0.59* 0.56*  CALCIUM 9.0 8.5* 8.9 8.6* 8.8*  MG 2.1 2.0 2.2 2.0 1.9  PHOS 4.5 3.0 3.0  --  3.6    ABG: Recent Labs  Lab 11/18/20 0230 11/18/20 1055  PHART 7.351 7.473*  PCO2ART 66.6* 53.1*  PO2ART 85.2 89.7  HCO3 35.8* 38.4*  O2SAT 95.4 97.9    Liver Function Tests: Recent Labs  Lab 11/14/20 0434 11/18/20 0310  AST 92*  --   ALT 134*  --   ALKPHOS 128*  --   BILITOT 0.6  --   PROT 6.9  --   ALBUMIN 1.8* 2.0*   No results for input(s): LIPASE, AMYLASE in the last 168 hours. No results  for input(s): AMMONIA in the last 168 hours.  CBC: Recent Labs  Lab 11/13/20 0410 11/15/20 0332 11/16/20 0436 11/18/20 0310  WBC 13.5* 12.0* 11.5* 13.4*  HGB 7.8* 7.7* 7.8* 8.3*  HCT 26.7* 27.2* 27.1* 28.3*  MCV 96.7 99.3 98.2 95.9  PLT 362 418* 442* 488*    Cardiac Enzymes: No results for input(s): CKTOTAL, CKMB, CKMBINDEX, TROPONINI in the last 168 hours.  BNP (last 3 results) No results for input(s): BNP in the last 8760 hours.  ProBNP (last 3 results) No results for input(s): PROBNP in the last 8760 hours.  Radiological Exams: DG CHEST PORT 1 VIEW  Result Date: 11/18/2020 CLINICAL DATA:  Evaluate coarse fibrosis. EXAM: PORTABLE CHEST 1 VIEW COMPARISON:  November 12, 2020. FINDINGS: Similar cardiomediastinal contour. Tracheostomy tube projects midline at the trachea and at the level of the clavicular heads. No substantial change in low lung volumes with diffuse interstitial prominence, which was better characterized on CT chest from November 03, 2020. Underlying bullous emphysematous change. No evidence of new superimposed confluent consolidation. No sizable pleural effusions or visible pneumothorax on this limited AP portable semi erect radiograph. Enteric tube courses below the diaphragm in outside the field of view. No acute osseous abnormality. IMPRESSION: No substantial change in low lung volumes with  diffuse interstitial prominence, which was better characterized on CT chest from November 03, 2020. Given the extent of disease, acute superimposed process is difficult to exclude. Electronically Signed   By: Feliberto Harts MD   On: 11/18/2020 07:25   DG Abd Portable 1V  Result Date: 11/19/2020 CLINICAL DATA:  Evaluate barium for possible gastrostomy tube placement. EXAM: PORTABLE ABDOMEN - 1 VIEW COMPARISON:  CT 11/13/2020 FINDINGS: There is a feeding tube that extends into the abdomen. Feeding tube tip is near the distal stomach and duodenal bulb. There is oral contrast  within the left and right colon. No significant gas in the mid transverse colon but this area is visualized due to bowel gas. Scattered small bowel gas throughout the abdomen and pelvis. Mid transverse colon appears to be overlying the stomach region. 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 IMPRESSION: Colon is visualized with a combination of barium and air. Nonobstructive bowel gas pattern. Electronically Signed   By: Richarda Overlie M.D.   On: 11/19/2020 07:58    Assessment/Plan Active Problems:   Acute on chronic respiratory failure with hypoxia (HCC)   Paroxysmal atrial fibrillation (HCC)   Cavitary pneumonia   Tracheostomy status (HCC)   1. Acute on chronic respiratory failure hypoxia patient continues on pressure assist control has been on 45% FiO2 for pain continue to be done today 2. Paroxysmal atrial fibrillation rate controlled 3. Pneumonia treated resolved 4. Tracheostomy remains place supposed to get a PEG today   I have personally seen and evaluated the patient, evaluated laboratory and imaging results, formulated the assessment and plan and placed orders. The Patient requires high complexity decision making with multiple systems involvement.  Rounds were done with the Respiratory Therapy Director and Staff therapists and discussed with nursing staff also.  Yevonne Pax, MD [facility] Pulmonary Critical Care Medicine Sleep Medicine

## 2020-11-19 NOTE — Progress Notes (Signed)
Patient received barium followed by portable abdominal x-ray for further assessment of anatomy in consideration for percutaneous gastrostomy tube placement in IR.   DG Abdomen 11/19/20 FINDINGS: There is a feeding tube that extends into the abdomen. Feeding tube tip is near the distal stomach and duodenal bulb. There is oral contrast within the left and right colon. No significant gas in the mid transverse colon but this area is visualized due to bowel gas. Scattered small bowel gas throughout the abdomen and pelvis. Mid transverse colon appears to be overlying the stomach region. 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 IMPRESSION: Colon is visualized with a combination of barium and air. Nonobstructive bowel gas pattern.  Imaging was evaluated by Dr. Miles Costain. No percutaneous window for placement of a gastrostomy tube by IR. No IR procedure planned and order for gastrostomy tube will be deleted. Camelia Eng, NP notified.   Please call IR with any questions/concerns.  Alwyn Ren, [place] [phone] 11/19/2020, 9:48 AM

## 2020-11-20 ENCOUNTER — Other Ambulatory Visit (HOSPITAL_COMMUNITY): Payer: Medicare Other

## 2020-11-20 DIAGNOSIS — J9621 Acute and chronic respiratory failure with hypoxia: Secondary | ICD-10-CM | POA: Diagnosis not present

## 2020-11-20 DIAGNOSIS — J939 Pneumothorax, unspecified: Secondary | ICD-10-CM

## 2020-11-20 DIAGNOSIS — Z9689 Presence of other specified functional implants: Secondary | ICD-10-CM

## 2020-11-20 DIAGNOSIS — I480 Paroxysmal atrial fibrillation: Secondary | ICD-10-CM | POA: Diagnosis not present

## 2020-11-20 DIAGNOSIS — J189 Pneumonia, unspecified organism: Secondary | ICD-10-CM | POA: Diagnosis not present

## 2020-11-20 LAB — URINALYSIS, ROUTINE W REFLEX MICROSCOPIC
Bilirubin Urine: NEGATIVE
Glucose, UA: NEGATIVE mg/dL
Ketones, ur: NEGATIVE mg/dL
Leukocytes,Ua: NEGATIVE
Nitrite: NEGATIVE
Protein, ur: 100 mg/dL — AB
RBC / HPF: >50 RBC/hpf — ABNORMAL HIGH (ref 0–5)
Specific Gravity, Urine: 1.016 (ref 1.005–1.030)
pH: 5 (ref 5.0–8.0)

## 2020-11-20 LAB — COMPREHENSIVE METABOLIC PANEL
ALT: 75 U/L — ABNORMAL HIGH (ref 0–44)
AST: 46 U/L — ABNORMAL HIGH (ref 15–41)
Albumin: 2.1 g/dL — ABNORMAL LOW (ref 3.5–5.0)
Alkaline Phosphatase: 137 U/L — ABNORMAL HIGH (ref 38–126)
Anion gap: 8 (ref 5–15)
BUN: 33 mg/dL — ABNORMAL HIGH (ref 8–23)
CO2: 32 mmol/L (ref 22–32)
Calcium: 8.5 mg/dL — ABNORMAL LOW (ref 8.9–10.3)
Chloride: 96 mmol/L — ABNORMAL LOW (ref 98–111)
Creatinine, Ser: 0.66 mg/dL (ref 0.61–1.24)
GFR, Estimated: >60 mL/min (ref >60)
Glucose, Bld: 114 mg/dL — ABNORMAL HIGH (ref 70–99)
Potassium: 3.5 mmol/L (ref 3.5–5.1)
Sodium: 136 mmol/L (ref 135–145)
Total Bilirubin: 0.5 mg/dL (ref 0.3–1.2)
Total Protein: 6.8 g/dL (ref 6.5–8.1)

## 2020-11-20 LAB — C DIFFICILE (CDIFF) QUICK SCRN (NO PCR REFLEX)
C Diff antigen: NEGATIVE
C Diff interpretation: NOT DETECTED
C Diff toxin: NEGATIVE

## 2020-11-20 LAB — CBC
HCT: 26.6 % — ABNORMAL LOW (ref 39.0–52.0)
Hemoglobin: 7.8 g/dL — ABNORMAL LOW (ref 13.0–17.0)
MCH: 27.5 pg (ref 26.0–34.0)
MCHC: 29.3 g/dL — ABNORMAL LOW (ref 30.0–36.0)
MCV: 93.7 fL (ref 80.0–100.0)
Platelets: 441 10*3/uL — ABNORMAL HIGH (ref 150–400)
RBC: 2.84 MIL/uL — ABNORMAL LOW (ref 4.22–5.81)
RDW: 19 % — ABNORMAL HIGH (ref 11.5–15.5)
WBC: 15.9 10*3/uL — ABNORMAL HIGH (ref 4.0–10.5)
nRBC: 0 % (ref 0.0–0.2)

## 2020-11-20 LAB — MAGNESIUM: Magnesium: 1.8 mg/dL (ref 1.7–2.4)

## 2020-11-20 NOTE — Progress Notes (Signed)
Pulmonary Critical Care Medicine [name]   PULMONARY CRITICAL CARE SERVICE  PROGRESS NOTE  Date of Service: 11/20/2020  Michael Massey  VVO:[id number]  DOB: [birth year]   DOA: 07-Nov-2020  Referring Physician: Carron Curie, MD  HPI: Michael Massey is a 69 y.o. male seen for follow up of Acute on Chronic Respiratory Failure.  Patient had a rather large pneumothorax yesterday after developing desaturations.  Patient ended up having to have emergent chest tube placed.  Now pneumothorax has resolved patient remains on pressure control mode.  His tidal volumes have been about 600-670cc in the pressure control mode I asked for respiratory therapy to try switching him to pressure support however he still has rather large tidal volumes.  In addition he did not tolerate the volume assist control.  We will need to continue to monitor him quite closely.  Medications: Reviewed on Rounds  Physical Exam:  Vitals: Temperature is 99.1 pulse 118 respiratory rate was 30 blood pressure 116/63 saturations 96%  Ventilator Settings on pressure assist control FiO2 65% PEEP 7 tidal volume 675  . General: Comfortable at this time . Eyes: Grossly normal lids, irises & conjunctiva . ENT: grossly tongue is normal . Neck: no obvious mass . Cardiovascular: S1 S2 normal no gallop . Respiratory: No rhonchi no rales are noted equal expansion . Abdomen: soft . Skin: no rash seen on limited exam . Musculoskeletal: not rigid . Psychiatric:unable to assess . Neurologic: no seizure no involuntary movements         Lab Data:   Basic Metabolic Panel: Recent Labs  Lab 11/14/20 0434 11/14/20 0434 11/15/20 0332 11/16/20 0436 11/17/20 0459 11/18/20 0310 11/20/20 0516  NA 142   < > 140 142 141 139 136  K 4.3   < > 4.4 4.6 4.9 4.3 3.5  CL 100   < > 97* 97* 97* 95* 96*  CO2 36*   < > 36* 35* 34* 36* 32  GLUCOSE 129*   < > 146* 90 143* 154* 114*  BUN 30*   < > 31* 32* 28* 26* 33*  CREATININE  0.65   < > 0.54* 0.53* 0.59* 0.56* 0.66  CALCIUM 9.0   < > 8.5* 8.9 8.6* 8.8* 8.5*  MG 2.1   < > 2.0 2.2 2.0 1.9 1.8  PHOS 4.5  --  3.0 3.0  --  3.6  --    < > = values in this interval not displayed.    ABG: Recent Labs  Lab 11/18/20 0230 11/18/20 1055 11/19/20 0950  PHART 7.351 7.473* 7.412  PCO2ART 66.6* 53.1* 56.6*  PO2ART 85.2 89.7 55.1*  HCO3 35.8* 38.4* 35.4*  O2SAT 95.4 97.9 84.8    Liver Function Tests: Recent Labs  Lab 11/14/20 0434 11/18/20 0310 11/20/20 0516  AST 92*  --  46*  ALT 134*  --  75*  ALKPHOS 128*  --  137*  BILITOT 0.6  --  0.5  PROT 6.9  --  6.8  ALBUMIN 1.8* 2.0* 2.1*   No results for input(s): LIPASE, AMYLASE in the last 168 hours. No results for input(s): AMMONIA in the last 168 hours.  CBC: Recent Labs  Lab 11/15/20 0332 11/16/20 0436 11/18/20 0310 11/20/20 0516  WBC 12.0* 11.5* 13.4* 15.9*  HGB 7.7* 7.8* 8.3* 7.8*  HCT 27.2* 27.1* 28.3* 26.6*  MCV 99.3 98.2 95.9 93.7  PLT 418* 442* 488* 441*    Cardiac Enzymes: No results for input(s): CKTOTAL, CKMB, CKMBINDEX, TROPONINI in the last  168 hours.  BNP (last 3 results) No results for input(s): BNP in the last 8760 hours.  ProBNP (last 3 results) No results for input(s): PROBNP in the last 8760 hours.  Radiological Exams: DG CHEST PORT 1 VIEW  Result Date: 11/20/2020 CLINICAL DATA:  Pneumothorax, chest tube EXAM: PORTABLE CHEST 1 VIEW COMPARISON:  11/19/2020 FINDINGS: Tracheostomy, nasoenteric feeding tube extending into the upper abdomen beyond the margin of the examination, and right pigtail chest tube are unchanged. Tiny laterally loculated right pneumothorax is unchanged from prior examination. Diffuse pulmonary infiltrate is again seen, unchanged from prior examination. Large pneumatocele again noted at the medial left lung base. Cardiac size within normal limits. No acute bone abnormality. IMPRESSION: Stable diffuse pulmonary infiltrate, likely infectious or inflammatory.  Right chest tube in place. Stable tiny laterally loculated right pneumothorax. Electronically Signed   By: Helyn Numbers MD   On: 11/20/2020 06:39   DG CHEST PORT 1 VIEW  Result Date: 11/19/2020 CLINICAL DATA:  Follow-up chest tube placement for right pneumothorax. EXAM: PORTABLE CHEST 1 VIEW COMPARISON:  11/19/2020 and 11/12/2020 FINDINGS: Right pigtail chest tube has been placed. Re-expansion of the right lung. There may be a small pneumothorax along the lateral aspect of the right chest. Again noted is a tracheostomy tube. Improved aeration in the left lung with persistent diffuse interstitial densities in left lung. Feeding tube extends into the abdomen but the tip is beyond the image. Small amount of subcutaneous gas in the right chest. IMPRESSION: 1. Placement of right chest tube and re-expansion of the right lung. There is probably a small residual right pneumothorax. 2. Diffuse interstitial densities in both lungs. Findings are similar to exam on 11/12/2020. Electronically Signed   By: Richarda Overlie M.D.   On: 11/19/2020 11:26   DG CHEST PORT 1 VIEW  Addendum Date: 11/19/2020   ADDENDUM REPORT: 11/19/2020 10:25 ADDENDUM: Critical Value/emergent results were called by telephone at the time of interpretation on 11/19/2020 at 10:25 am to provider [facility] , who verbally acknowledged these results. Electronically Signed   By: Lupita Raider M.D.   On: 11/19/2020 10:25   Result Date: 11/19/2020 CLINICAL DATA:  Respiratory distress. EXAM: PORTABLE CHEST 1 VIEW COMPARISON:  November 18, 2020. FINDINGS: Tracheostomy tube is in good position. There is interval development of large right pneumothorax. Diffuse coarse opacities are noted in the left lung concerning for pneumonia or edema. Bony thorax is unremarkable. Feeding tube is seen entering stomach. IMPRESSION: Interval development of large right pneumothorax. Diffuse coarse opacities are noted in the left lung concerning for pneumonia or edema.  Electronically Signed: By: Lupita Raider M.D. On: 11/19/2020 10:21   DG Abd Portable 1V  Result Date: 11/19/2020 CLINICAL DATA:  Evaluate barium for possible gastrostomy tube placement. EXAM: PORTABLE ABDOMEN - 1 VIEW COMPARISON:  CT 11/13/2020 FINDINGS: There is a feeding tube that extends into the abdomen. Feeding tube tip is near the distal stomach and duodenal bulb. There is oral contrast within the left and right colon. No significant gas in the mid transverse colon but this area is visualized due to bowel gas. Scattered small bowel gas throughout the abdomen and pelvis. Mid transverse colon appears to be overlying the stomach region. 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 IMPRESSION: Colon is visualized with a combination of barium and air. Nonobstructive bowel gas pattern. Electronically Signed   By: Richarda Overlie M.D.   On: 11/19/2020 07:58  Assessment/Plan Active Problems:   Acute on chronic respiratory failure with hypoxia (HCC)   Paroxysmal atrial fibrillation (HCC)   Cavitary pneumonia   Tracheostomy status (HCC)   1. Acute on chronic respiratory failure with hypoxia patient is doing better since the chest tube placement.  Suspect that the pneumothorax may have been related to multiple factors including presence of bullous emphysema as well as increased stiffness of the long and mechanical ventilation. 2. Paroxysmal atrial fibrillation rate now rate is controlled we will continue to monitor. 3. Pneumonia we will continue with supportive care follow-up x-rays. 4. Tracheostomy remains in place   I have personally seen and evaluated the patient, evaluated laboratory and imaging results, formulated the assessment and plan and placed orders. The Patient requires high complexity decision making with multiple systems involvement.  Rounds were done with the Respiratory Therapy Director and Staff therapists and discussed with nursing staff  also.  Yevonne Pax, MD [facility] Pulmonary Critical Care Medicine Sleep Medicine

## 2020-11-21 ENCOUNTER — Other Ambulatory Visit (HOSPITAL_COMMUNITY): Payer: Medicare Other

## 2020-11-21 DIAGNOSIS — I480 Paroxysmal atrial fibrillation: Secondary | ICD-10-CM | POA: Diagnosis not present

## 2020-11-21 DIAGNOSIS — J189 Pneumonia, unspecified organism: Secondary | ICD-10-CM | POA: Diagnosis not present

## 2020-11-21 DIAGNOSIS — Z930 Tracheostomy status: Secondary | ICD-10-CM | POA: Diagnosis not present

## 2020-11-21 DIAGNOSIS — J9621 Acute and chronic respiratory failure with hypoxia: Secondary | ICD-10-CM | POA: Diagnosis not present

## 2020-11-21 LAB — URINE CULTURE: Culture: NO GROWTH

## 2020-11-21 NOTE — Progress Notes (Signed)
Pulmonary Critical Care Medicine [facility] [place]   PULMONARY CRITICAL CARE SERVICE  PROGRESS NOTE  Date of Service: 11/21/2020  Michael Massey  IHK:[id number]  DOB: Jun 25, 1951   DOA: 11/08/2020  Referring Physician: Carron Curie, MD  HPI: Michael Massey is a 69 y.o. male seen for follow up of Acute on Chronic Respiratory Failure.  Patient was attempted on pressure support yesterday did not tolerate.  Right now is on pressure control mode full support  Medications: Reviewed on Rounds  Physical Exam:  Vitals: Temperature 97.7 pulse 101 respiratory rate 35 blood pressure is 139/80 saturations 96%  Ventilator Settings pressure assist control FiO2 of 70% IP 12 PEEP 5  . General: Comfortable at this time . Eyes: Grossly normal lids, irises & conjunctiva . ENT: grossly tongue is normal . Neck: no obvious mass . Cardiovascular: S1 S2 normal no gallop . Respiratory: Coarse rhonchi noted bilaterally . Abdomen: soft . Skin: no rash seen on limited exam . Musculoskeletal: not rigid . Psychiatric:unable to assess . Neurologic: no seizure no involuntary movements         Lab Data:   Basic Metabolic Panel: Recent Labs  Lab 11/15/20 0332 11/16/20 0436 11/17/20 0459 11/18/20 0310 11/20/20 0516  NA 140 142 141 139 136  K 4.4 4.6 4.9 4.3 3.5  CL 97* 97* 97* 95* 96*  CO2 36* 35* 34* 36* 32  GLUCOSE 146* 90 143* 154* 114*  BUN 31* 32* 28* 26* 33*  CREATININE 0.54* 0.53* 0.59* 0.56* 0.66  CALCIUM 8.5* 8.9 8.6* 8.8* 8.5*  MG 2.0 2.2 2.0 1.9 1.8  PHOS 3.0 3.0  --  3.6  --     ABG: Recent Labs  Lab 11/18/20 0230 11/18/20 1055 11/19/20 0950  PHART 7.351 7.473* 7.412  PCO2ART 66.6* 53.1* 56.6*  PO2ART 85.2 89.7 55.1*  HCO3 35.8* 38.4* 35.4*  O2SAT 95.4 97.9 84.8    Liver Function Tests: Recent Labs  Lab 11/18/20 0310 11/20/20 0516  AST  --  46*  ALT  --  75*  ALKPHOS  --  137*  BILITOT  --  0.5  PROT  --  6.8  ALBUMIN 2.0* 2.1*   No results for  input(s): LIPASE, AMYLASE in the last 168 hours. No results for input(s): AMMONIA in the last 168 hours.  CBC: Recent Labs  Lab 11/15/20 0332 11/16/20 0436 11/18/20 0310 11/20/20 0516  WBC 12.0* 11.5* 13.4* 15.9*  HGB 7.7* 7.8* 8.3* 7.8*  HCT 27.2* 27.1* 28.3* 26.6*  MCV 99.3 98.2 95.9 93.7  PLT 418* 442* 488* 441*    Cardiac Enzymes: No results for input(s): CKTOTAL, CKMB, CKMBINDEX, TROPONINI in the last 168 hours.  BNP (last 3 results) No results for input(s): BNP in the last 8760 hours.  ProBNP (last 3 results) No results for input(s): PROBNP in the last 8760 hours.  Radiological Exams: DG CHEST PORT 1 VIEW  Result Date: 11/21/2020 CLINICAL DATA:  Pneumothorax EXAM: PORTABLE CHEST 1 VIEW COMPARISON:  Yesterday FINDINGS: Right chest tube in place. Small superolateral right pneumothorax is not confidently unchanged. Confluent interstitial and airspace disease. Cyst at the medial left base. Normal heart size and stable mediastinal contours. The feeding tube and tracheostomy tube remain in place. IMPRESSION: Stable hardware, airspace disease, and small right pneumothorax. Electronically Signed   By: Marnee Spring M.D.   On: 11/21/2020 07:43   DG CHEST PORT 1 VIEW  Result Date: 11/20/2020 CLINICAL DATA:  Pneumothorax, chest tube EXAM: PORTABLE CHEST 1 VIEW COMPARISON:  11/19/2020  FINDINGS: Tracheostomy, nasoenteric feeding tube extending into the upper abdomen beyond the margin of the examination, and right pigtail chest tube are unchanged. Tiny laterally loculated right pneumothorax is unchanged from prior examination. Diffuse pulmonary infiltrate is again seen, unchanged from prior examination. Large pneumatocele again noted at the medial left lung base. Cardiac size within normal limits. No acute bone abnormality. IMPRESSION: Stable diffuse pulmonary infiltrate, likely infectious or inflammatory. Right chest tube in place. Stable tiny laterally loculated right pneumothorax.  Electronically Signed   By: Helyn Numbers MD   On: 11/20/2020 06:39   DG CHEST PORT 1 VIEW  Result Date: 11/19/2020 CLINICAL DATA:  Follow-up chest tube placement for right pneumothorax. EXAM: PORTABLE CHEST 1 VIEW COMPARISON:  11/19/2020 and 11/12/2020 FINDINGS: Right pigtail chest tube has been placed. Re-expansion of the right lung. There may be a small pneumothorax along the lateral aspect of the right chest. Again noted is a tracheostomy tube. Improved aeration in the left lung with persistent diffuse interstitial densities in left lung. Feeding tube extends into the abdomen but the tip is beyond the image. Small amount of subcutaneous gas in the right chest. IMPRESSION: 1. Placement of right chest tube and re-expansion of the right lung. There is probably a small residual right pneumothorax. 2. Diffuse interstitial densities in both lungs. Findings are similar to exam on 11/12/2020. Electronically Signed   By: Richarda Overlie M.D.   On: 11/19/2020 11:26    Assessment/Plan Active Problems:   Acute on chronic respiratory failure with hypoxia (HCC)   Paroxysmal atrial fibrillation (HCC)   Cavitary pneumonia   Tracheostomy status (HCC)   1. Acute on chronic respiratory failure with hypoxia patient is still requiring high oxygen levels the chest tube looks like it was in good place with reexpansion of the lung small residual pneumothorax noted on the latest chest film we will try to titrate the FiO2 2. Paroxysmal atrial fibrillation rate is controlled at this time 3. Pneumonia slowly improving 4. Tracheostomy remains in place   I have personally seen and evaluated the patient, evaluated laboratory and imaging results, formulated the assessment and plan and placed orders. The Patient requires high complexity decision making with multiple systems involvement.  Rounds were done with the Respiratory Therapy Director and Staff therapists and discussed with nursing staff also.  Yevonne Pax, MD  [facility] Pulmonary Critical Care Medicine Sleep Medicine

## 2020-11-21 DEATH — deceased

## 2020-11-22 ENCOUNTER — Other Ambulatory Visit (HOSPITAL_COMMUNITY): Payer: Medicare Other

## 2020-11-22 DIAGNOSIS — Z9689 Presence of other specified functional implants: Secondary | ICD-10-CM | POA: Diagnosis not present

## 2020-11-22 DIAGNOSIS — J189 Pneumonia, unspecified organism: Secondary | ICD-10-CM | POA: Diagnosis not present

## 2020-11-22 DIAGNOSIS — I480 Paroxysmal atrial fibrillation: Secondary | ICD-10-CM | POA: Diagnosis not present

## 2020-11-22 DIAGNOSIS — J9621 Acute and chronic respiratory failure with hypoxia: Secondary | ICD-10-CM | POA: Diagnosis not present

## 2020-11-22 LAB — CULTURE, RESPIRATORY W GRAM STAIN: Culture: NORMAL

## 2020-11-22 NOTE — Progress Notes (Signed)
Pulmonary Critical Care Medicine [facility] [place]   PULMONARY CRITICAL CARE SERVICE  PROGRESS NOTE  Date of Service: 12/01/2020  Michael Massey  KCM:[id number]  DOB: 09/03/1951   DOA: 22-Nov-2020  Referring Physician: Carron Curie, MD  HPI: Michael Massey is a 69 y.o. male seen for follow up of Acute on Chronic Respiratory Failure.  Patient with currently is on pressure control has been on 65% FiO2 this morning x-ray showed pneumothorax to be slightly worsened..  The suction was the issue along with kinking of the tube will do follow-up x-ray  Medications: Reviewed on Rounds  Physical Exam:  Vitals: Temperature is 96.7 pulse 108 respiratory rate 35 blood pressure is 114/75 saturations 94%.  Ventilator Settings on pressure assist control FiO2 65% IP 12 PEEP 7  . General: Comfortable at this time . Eyes: Grossly normal lids, irises & conjunctiva . ENT: grossly tongue is normal . Neck: no obvious mass . Cardiovascular: S1 S2 normal no gallop . Respiratory: Scattered rhonchi expansion equal . Abdomen: soft . Skin: no rash seen on limited exam . Musculoskeletal: not rigid . Psychiatric:unable to assess . Neurologic: no seizure no involuntary movements         Lab Data:   Basic Metabolic Panel: Recent Labs  Lab 11/16/20 0436 11/17/20 0459 11/18/20 0310 11/20/20 0516  NA 142 141 139 136  K 4.6 4.9 4.3 3.5  CL 97* 97* 95* 96*  CO2 35* 34* 36* 32  GLUCOSE 90 143* 154* 114*  BUN 32* 28* 26* 33*  CREATININE 0.53* 0.59* 0.56* 0.66  CALCIUM 8.9 8.6* 8.8* 8.5*  MG 2.2 2.0 1.9 1.8  PHOS 3.0  --  3.6  --     ABG: Recent Labs  Lab 11/18/20 0230 11/18/20 1055 11/19/20 0950  PHART 7.351 7.473* 7.412  PCO2ART 66.6* 53.1* 56.6*  PO2ART 85.2 89.7 55.1*  HCO3 35.8* 38.4* 35.4*  O2SAT 95.4 97.9 84.8    Liver Function Tests: Recent Labs  Lab 11/18/20 0310 11/20/20 0516  AST  --  46*  ALT  --  75*  ALKPHOS  --  137*  BILITOT  --  0.5  PROT  --  6.8  ALBUMIN  2.0* 2.1*   No results for input(s): LIPASE, AMYLASE in the last 168 hours. No results for input(s): AMMONIA in the last 168 hours.  CBC: Recent Labs  Lab 11/16/20 0436 11/18/20 0310 11/20/20 0516  WBC 11.5* 13.4* 15.9*  HGB 7.8* 8.3* 7.8*  HCT 27.1* 28.3* 26.6*  MCV 98.2 95.9 93.7  PLT 442* 488* 441*    Cardiac Enzymes: No results for input(s): CKTOTAL, CKMB, CKMBINDEX, TROPONINI in the last 168 hours.  BNP (last 3 results) No results for input(s): BNP in the last 8760 hours.  ProBNP (last 3 results) No results for input(s): PROBNP in the last 8760 hours.  Radiological Exams: DG Chest Port 1 View  Result Date: 12/14/2020 CLINICAL DATA:  Chest tube. EXAM: PORTABLE CHEST 1 VIEW COMPARISON:  11/21/2020.  CT 11/03/2020. FINDINGS: Tracheostomy tube and feeding tube in stable position. Right chest tube in stable position. Interim progression of right-sided pneumothorax has increased in size on today's exam. No evidence of tension. Diffuse severe bilateral pulmonary infiltrates/edema again noted. Prominent blebs again noted. Stable cardiomegaly. IMPRESSION: 1. Lines and tubes including right chest tube in stable position. Right-sided pneumothorax has increased in size on today's exam. No evidence of tension. 2. Diffuse severe bilateral pulmonary infiltrates/edema again noted. These results will be called to the ordering clinician  or representative by the Radiologist Assistant, and communication documented in the PACS or [employer]. Electronically Signed   By: Maisie Fus  Register   On: 11/26/2020 07:34   DG CHEST PORT 1 VIEW  Result Date: 11/21/2020 CLINICAL DATA:  Pneumothorax EXAM: PORTABLE CHEST 1 VIEW COMPARISON:  Yesterday FINDINGS: Right chest tube in place. Small superolateral right pneumothorax is not confidently unchanged. Confluent interstitial and airspace disease. Cyst at the medial left base. Normal heart size and stable mediastinal contours. The feeding tube and  tracheostomy tube remain in place. IMPRESSION: Stable hardware, airspace disease, and small right pneumothorax. Electronically Signed   By: Marnee Spring M.D.   On: 11/21/2020 07:43    Assessment/Plan Active Problems:   Acute on chronic respiratory failure with hypoxia (HCC)   Paroxysmal atrial fibrillation (HCC)   Cavitary pneumonia   Tracheostomy status (HCC)   1. Acute on chronic respiratory failure with hypoxia again patient has severe bullous emphysematous changes.  I spoke with his son today who stated that he did not ever smoke but he did have some occupational exposure and it appears that he may have developed emphysematous changes from his occupation.  The patient still has the issue with the pneumothorax with possibility of rupturing one of his blebs he could also have a bronchopleural fistula and of course he is not a candidate for any surgical intervention at this stage.  In the end said his son states that he wants to give him about another week and then he may make a decision to withdraw life support.  The son also stated that the patient had made it very clear that he would not want to be kept alive in this manner. 2. Paroxysmal atrial fibrillation right now is rate controlled we will continue with supportive care. 3. 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 4. Tracheostomy remains in place   I have personally seen and evaluated the patient, evaluated laboratory and imaging results, formulated the assessment and plan and placed orders. The Patient requires high complexity decision making with multiple systems involvement.  Rounds were done with the Respiratory Therapy Director and Staff therapists and discussed with nursing staff also.  Yevonne Pax, MD [facility] Pulmonary Critical Care Medicine Sleep Medicine

## 2020-11-23 ENCOUNTER — Other Ambulatory Visit (HOSPITAL_COMMUNITY): Payer: Medicare Other

## 2020-12-22 DEATH — deceased

## 2022-07-03 IMAGING — DX DG CHEST 1V PORT
1 series · 1 of 1 positions shown · non-contrast
Comparison: Earlier same day

CLINICAL DATA: Pneumothorax.

EXAM:
PORTABLE CHEST 1 VIEW

[chest]
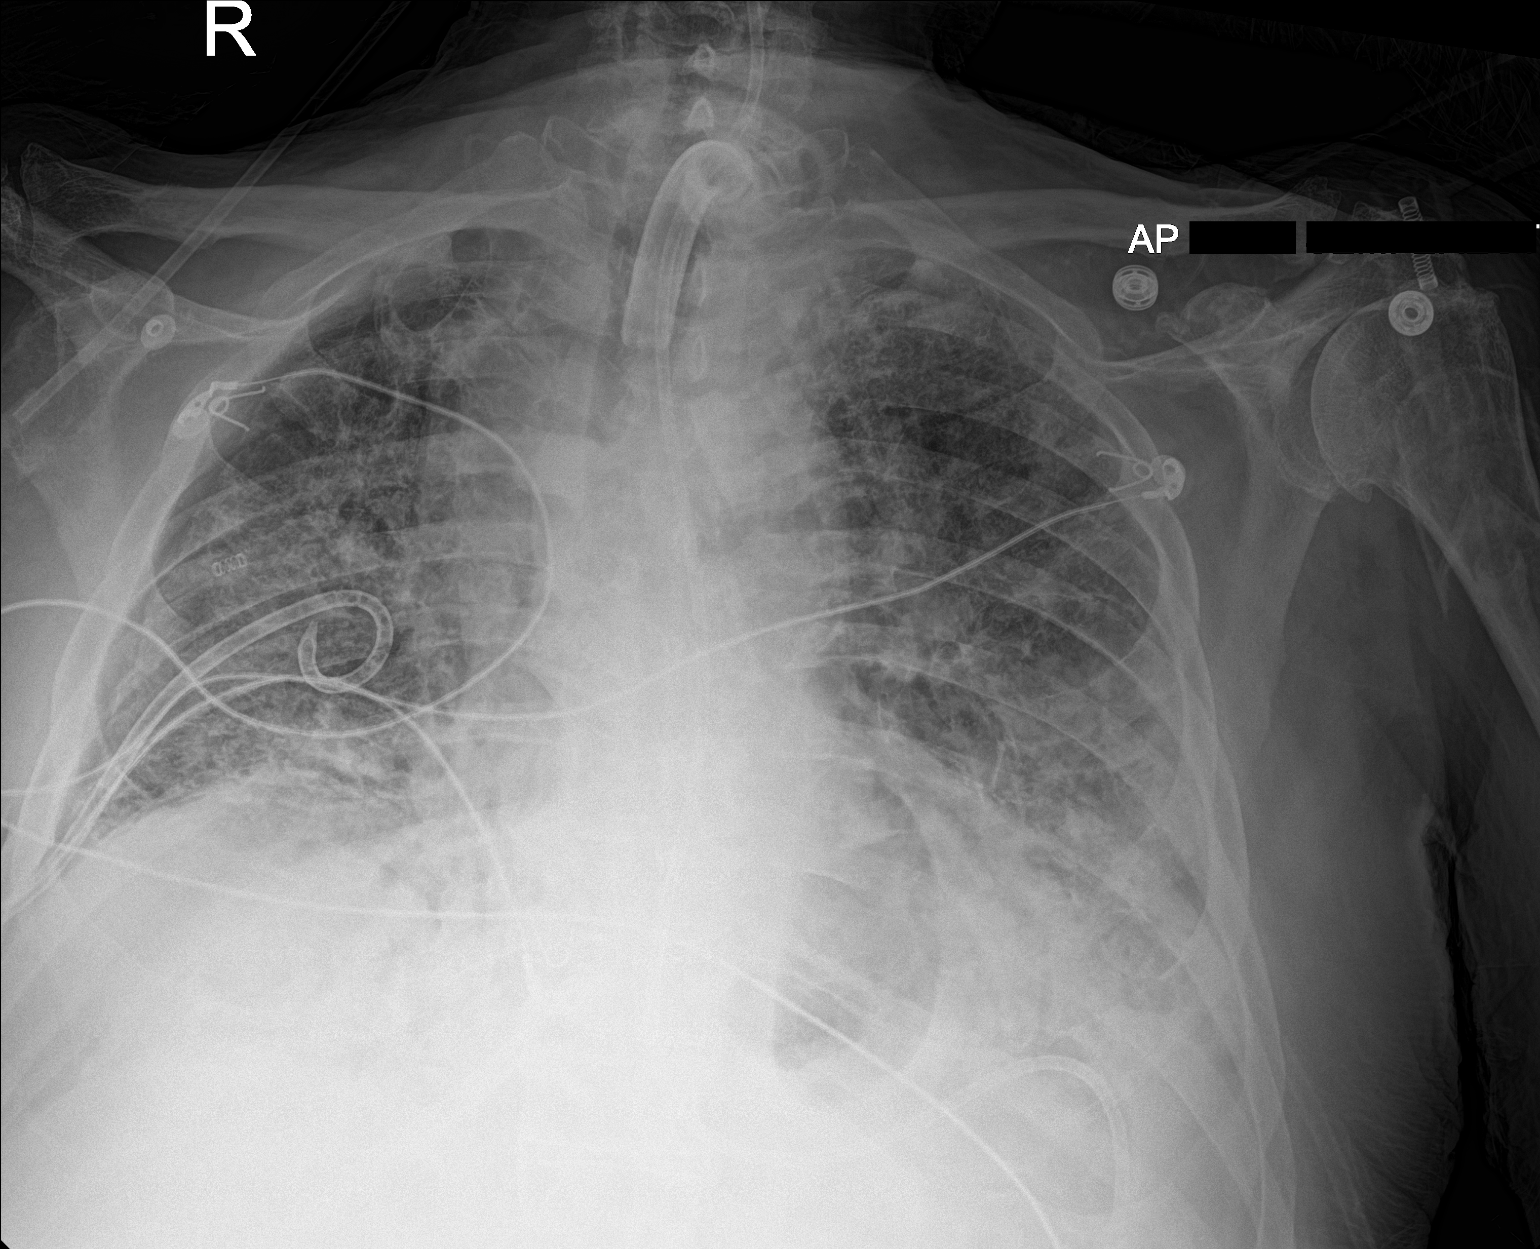

[1 of 1 positions shown; findings below may reference images not displayed]

FINDINGS: 7565 hours. The cardio pericardial silhouette is enlarged. Diffuse
interstitial and patchy bilateral airspace disease is similar to
prior. Stable small right-sided pneumothorax. Dorisita ostomy tube
again noted. A feeding tube passes into the stomach although the
distal tip position is not included on the film. Right pigtail
pleural drain is stable. Telemetry leads overlie the chest.
IMPRESSION: 1. No substantial interval change.
2. Stable small right-sided pneumothorax.
3. Diffuse interstitial and patchy bilateral airspace disease.
